# Patient Record
Sex: Male | Born: 1960 | Race: White | Hispanic: No | Marital: Single | Smoking: Current every day smoker
Health system: Southern US, Community
[De-identification: ages and names within clinical notes are randomized; demographics above are authoritative.]

## PROBLEM LIST (undated history)

## (undated) DIAGNOSIS — F32A Depression, unspecified: Secondary | ICD-10-CM

## (undated) DIAGNOSIS — F329 Major depressive disorder, single episode, unspecified: Secondary | ICD-10-CM

## (undated) DIAGNOSIS — I10 Essential (primary) hypertension: Secondary | ICD-10-CM

## (undated) DIAGNOSIS — F4310 Post-traumatic stress disorder, unspecified: Secondary | ICD-10-CM

## (undated) DIAGNOSIS — I219 Acute myocardial infarction, unspecified: Secondary | ICD-10-CM

## (undated) DIAGNOSIS — F419 Anxiety disorder, unspecified: Secondary | ICD-10-CM

## (undated) DIAGNOSIS — I2510 Atherosclerotic heart disease of native coronary artery without angina pectoris: Secondary | ICD-10-CM

## (undated) DIAGNOSIS — E785 Hyperlipidemia, unspecified: Secondary | ICD-10-CM

## (undated) HISTORY — DX: Major depressive disorder, single episode, unspecified: F32.9

## (undated) HISTORY — PX: OTHER SURGICAL HISTORY: SHX169

## (undated) HISTORY — DX: Depression, unspecified: F32.A

## (undated) HISTORY — PX: APPENDECTOMY: SHX54

## (undated) HISTORY — DX: Post-traumatic stress disorder, unspecified: F43.10

## (undated) HISTORY — PX: JOINT REPLACEMENT: SHX530

## (undated) HISTORY — DX: Acute myocardial infarction, unspecified: I21.9

## (undated) HISTORY — DX: Anxiety disorder, unspecified: F41.9

---

## 2008-07-19 ENCOUNTER — Emergency Department: Payer: Self-pay | Admitting: Internal Medicine

## 2008-12-21 ENCOUNTER — Emergency Department: Payer: Self-pay | Admitting: Emergency Medicine

## 2009-08-24 ENCOUNTER — Emergency Department: Payer: Self-pay | Admitting: Emergency Medicine

## 2009-08-25 ENCOUNTER — Observation Stay: Payer: Self-pay | Admitting: Internal Medicine

## 2009-08-25 ENCOUNTER — Ambulatory Visit: Payer: Self-pay | Admitting: Internal Medicine

## 2009-09-03 ENCOUNTER — Ambulatory Visit: Payer: Self-pay | Admitting: Internal Medicine

## 2009-09-24 ENCOUNTER — Ambulatory Visit: Payer: Self-pay | Admitting: Internal Medicine

## 2009-10-25 ENCOUNTER — Ambulatory Visit: Payer: Self-pay | Admitting: Internal Medicine

## 2009-11-25 ENCOUNTER — Ambulatory Visit: Payer: Self-pay | Admitting: Internal Medicine

## 2009-12-13 ENCOUNTER — Ambulatory Visit: Payer: Self-pay | Admitting: General Practice

## 2009-12-15 ENCOUNTER — Ambulatory Visit: Payer: Self-pay | Admitting: General Practice

## 2009-12-20 ENCOUNTER — Ambulatory Visit: Payer: Self-pay | Admitting: General Practice

## 2009-12-25 ENCOUNTER — Ambulatory Visit: Payer: Self-pay | Admitting: Internal Medicine

## 2010-03-27 HISTORY — PX: LUMBAR LAMINECTOMY: SHX95

## 2017-01-19 ENCOUNTER — Ambulatory Visit
Admission: EM | Admit: 2017-01-19 | Discharge: 2017-01-19 | Disposition: A | Discharge status: Home / Self Care | Payer: Commercial Managed Care - HMO | Attending: Family Medicine | Admitting: Family Medicine

## 2017-01-19 ENCOUNTER — Encounter: Payer: Self-pay | Admitting: Emergency Medicine

## 2017-01-19 DIAGNOSIS — M542 Cervicalgia: Secondary | ICD-10-CM

## 2017-01-19 DIAGNOSIS — F4321 Adjustment disorder with depressed mood: Secondary | ICD-10-CM

## 2017-01-19 DIAGNOSIS — F418 Other specified anxiety disorders: Secondary | ICD-10-CM | POA: Diagnosis not present

## 2017-01-19 HISTORY — DX: Hyperlipidemia, unspecified: E78.5

## 2017-01-19 HISTORY — DX: Essential (primary) hypertension: I10

## 2017-01-19 HISTORY — DX: Atherosclerotic heart disease of native coronary artery without angina pectoris: I25.10

## 2017-01-19 MED ORDER — LORAZEPAM 1 MG PO TABS: 1.0000 mg | ORAL_TABLET | Freq: Three times a day (TID) | ORAL | 0 refills | Status: DC | PRN

## 2017-01-19 MED ORDER — CYCLOBENZAPRINE HCL 10 MG PO TABS: 10.0000 mg | ORAL_TABLET | Freq: Three times a day (TID) | ORAL | 0 refills | Status: DC | PRN

## 2017-01-19 NOTE — ED Provider Notes (Signed)
[facility]    CSN: [id number] Arrival date & time: 01/19/17  1191     History   Chief Complaint Chief Complaint  Patient presents with  . Neck Pain  . Shoulder Pain    HPI James Bryan is a 56 y.o. male.   56 yo male with a c/o 6 days h/o upper back and neck pain worse on the left side which he attributes to severe tension. Denies any injury, falls, trauma, fevers, chills, vision changes, numbness/tingling. States he's been under an unbelievable amount of stress, tension and anxiety over the past week because of his 35 month old grandson died of SIDS on 2017/02/06 while his grandson was staying with him. Patient is grieving. Patient denies suicidal ideation. Patient states his grandson's funeral is tomorrow.    The history is provided by the patient.  Neck Pain  Shoulder Pain  Associated symptoms: neck pain     Past Medical History:  Diagnosis Date  . Coronary artery disease   . Hyperlipidemia   . Hypertension     There are no active problems to display for this patient.   Past Surgical History:  Procedure Laterality Date  . APPENDECTOMY    . JOINT REPLACEMENT Right    knee       Home Medications    Prior to Admission medications   Medication Sig Start Date End Date Taking? Authorizing Provider  aspirin EC 81 MG tablet Take 81 mg by mouth daily.   Yes [provider]  atorvastatin (LIPITOR) 40 MG tablet Take 40 mg by mouth daily.   Yes [provider]  clopidogrel (PLAVIX) 75 MG tablet Take 75 mg by mouth daily.   Yes [provider]  megestrol (MEGACE) 40 MG tablet Take 40 mg by mouth daily.   Yes [provider]  metoprolol succinate (TOPROL-XL) 100 MG 24 hr tablet Take 100 mg by mouth daily. Take with or immediately following a meal.   Yes [provider]  mirtazapine (REMERON) 15 MG tablet Take 15 mg by mouth at bedtime.   Yes [provider]  montelukast (SINGULAIR) 10 MG tablet Take 10  mg by mouth at bedtime.   Yes [provider]  QUEtiapine (SEROQUEL) 400 MG tablet Take 800 mg by mouth at bedtime.   Yes [provider]  Vitamin D, Ergocalciferol, (DRISDOL) 50000 units CAPS capsule Take 50,000 Units by mouth every 7 (seven) days.   Yes [provider]  cyclobenzaprine (FLEXERIL) 10 MG tablet Take 1 tablet (10 mg total) by mouth 3 (three) times daily as needed for muscle spasms. 01/19/17   Payton Mccallum, MD  LORazepam (ATIVAN) 1 MG tablet Take 1 tablet (1 mg total) by mouth every 8 (eight) hours as needed for anxiety. DO NOT TAKE WITH FLEXERIL 01/19/17   Payton Mccallum, MD    Family History Family History  Problem Relation Age of Onset  . Diabetes Mother   . Depression Mother   . Alzheimer's disease Mother   . Diabetes Father   . Glaucoma Father     Social History Social History  Substance Use Topics  . Smoking status: Current Every Day Smoker    Packs/day: 0.50    Types: Cigarettes  . Smokeless tobacco: Never Used  . Alcohol use No     Allergies   Penicillins and Sulfa antibiotics   Review of Systems Review of Systems  Musculoskeletal: Positive for neck pain.     Physical Exam Triage Vital Signs  ED Triage Vitals  Enc Vitals Group     BP 01/19/17 0907 115/76     Pulse Rate 01/19/17 0907 88     Resp 01/19/17 0907 16     Temp 01/19/17 0907 98.5 F (36.9 C)     Temp Source 01/19/17 0907 Oral     SpO2 01/19/17 0907 97 %     Weight 01/19/17 0906 162 lb (73.5 kg)     Height 01/19/17 0906 5\' 8"  (1.727 m)     Head Circumference --      Peak Flow --      Pain Score 01/19/17 0906 7     Pain Loc --      Pain Edu? --      Excl. in GC? --    No data found.   Updated Vital Signs BP 115/76 (BP Location: Right Arm)   Pulse 88   Temp 98.5 F (36.9 C) (Oral)   Resp 16   Ht 5\' 8"  (1.727 m)   Wt 162 lb (73.5 kg)   SpO2 97%   BMI 24.63 kg/m   Visual Acuity Right Eye Distance:   Left Eye Distance:   Bilateral  Distance:    Right Eye Near:   Left Eye Near:    Bilateral Near:     Physical Exam  Constitutional: He appears well-developed and well-nourished. No distress.  Neck: Normal range of motion. Neck supple. No tracheal deviation present.  Pulmonary/Chest: Effort normal. No stridor. No respiratory distress.  Musculoskeletal:       Cervical back: He exhibits tenderness (over the trapezius muscles bilaterally (left greater than right) and cervical paraspinous muscles) and spasm. He exhibits normal range of motion, no bony tenderness, no swelling, no edema, no deformity, no laceration, no pain and normal pulse.  Neurological: He is alert. He has normal reflexes. He displays normal reflexes. He exhibits normal muscle tone. Coordination normal.  Skin: No rash noted. He is not diaphoretic.  Nursing note and vitals reviewed.    UC Treatments / Results  Labs (all labs ordered are listed, but only abnormal results are displayed) Labs Reviewed - No data to display  EKG  EKG Interpretation None       Radiology No results found.  Procedures Procedures (including critical care time)  Medications Ordered in UC Medications - No data to display   Initial Impression / Assessment and Plan / UC Course  I have reviewed the triage vital signs and the nursing notes.  Pertinent labs & imaging results that were available during my care of the patient were reviewed by me and considered in my medical decision making (see chart for details).       Final Clinical Impressions(s) / UC Diagnoses   Final diagnoses:  Neck pain  Situational anxiety  Grieving    New Prescriptions Discharge Medication List as of 01/19/2017  9:37 AM    START taking these medications   Details  cyclobenzaprine (FLEXERIL) 10 MG tablet Take 1 tablet (10 mg total) by mouth 3 (three) times daily as needed for muscle spasms., Starting Fri 01/19/2017, Normal    LORazepam (ATIVAN) 1 MG tablet Take 1 tablet (1 mg total)  by mouth every 8 (eight) hours as needed for anxiety. DO NOT TAKE WITH FLEXERIL, Starting Fri 01/19/2017, Print       1. diagnosis reviewed with patient 2. rx as per orders above; reviewed possible side effects, interactions, risks and benefits  3. Recommend supportive treatment with behavioral/psychological therapy 4. Follow-up  with PCP 5. Follow-up  prn if symptoms worsen or don't improve   Controlled Substance Prescriptions [place] Controlled Substance Registry consulted? Not Applicable   Payton Mccallumonty, Tiare Rohlman, MD 01/19/17 1059

## 2017-01-19 NOTE — ED Triage Notes (Signed)
Patient in today c/o neck pain radiating to left shoulder. Patient states that he thinks it is due to tension. Patient's 303 month old grandson passed away of SIDS on 01/14/17.

## 2017-01-19 NOTE — Discharge Instructions (Signed)
Tylenol as needed Heat to area

## 2017-04-05 DIAGNOSIS — I2510 Atherosclerotic heart disease of native coronary artery without angina pectoris: Secondary | ICD-10-CM | POA: Insufficient documentation

## 2017-04-19 DIAGNOSIS — M549 Dorsalgia, unspecified: Secondary | ICD-10-CM

## 2017-04-19 DIAGNOSIS — G8929 Other chronic pain: Secondary | ICD-10-CM | POA: Insufficient documentation

## 2017-04-19 DIAGNOSIS — Z96651 Presence of right artificial knee joint: Secondary | ICD-10-CM

## 2017-04-19 DIAGNOSIS — M25561 Pain in right knee: Secondary | ICD-10-CM

## 2017-07-13 ENCOUNTER — Emergency Department: Payer: Medicare HMO

## 2017-07-13 ENCOUNTER — Other Ambulatory Visit: Payer: Self-pay

## 2017-07-13 ENCOUNTER — Emergency Department
Admission: EM | Admit: 2017-07-13 | Discharge: 2017-07-13 | Disposition: A | Discharge status: Home / Self Care | Payer: Medicare HMO | Attending: Emergency Medicine | Admitting: Emergency Medicine

## 2017-07-13 DIAGNOSIS — Z7982 Long term (current) use of aspirin: Secondary | ICD-10-CM | POA: Insufficient documentation

## 2017-07-13 DIAGNOSIS — Z79899 Other long term (current) drug therapy: Secondary | ICD-10-CM | POA: Diagnosis not present

## 2017-07-13 DIAGNOSIS — R569 Unspecified convulsions: Secondary | ICD-10-CM | POA: Diagnosis present

## 2017-07-13 DIAGNOSIS — F17210 Nicotine dependence, cigarettes, uncomplicated: Secondary | ICD-10-CM | POA: Insufficient documentation

## 2017-07-13 DIAGNOSIS — I2510 Atherosclerotic heart disease of native coronary artery without angina pectoris: Secondary | ICD-10-CM | POA: Diagnosis not present

## 2017-07-13 DIAGNOSIS — I119 Hypertensive heart disease without heart failure: Secondary | ICD-10-CM | POA: Diagnosis not present

## 2017-07-13 DIAGNOSIS — G40909 Epilepsy, unspecified, not intractable, without status epilepticus: Secondary | ICD-10-CM

## 2017-07-13 LAB — CBC WITH DIFFERENTIAL/PLATELET
BASOS ABS: 0 10*3/uL (ref 0–0.1)
BASOS PCT: 0 %
Eosinophils Absolute: 0.2 10*3/uL (ref 0–0.7)
Eosinophils Relative: 1 %
HEMATOCRIT: 40 % (ref 40.0–52.0)
HEMOGLOBIN: 13.3 g/dL (ref 13.0–18.0)
Lymphocytes Relative: 15 %
Lymphs Abs: 1.7 10*3/uL (ref 1.0–3.6)
MCH: 28.6 pg (ref 26.0–34.0)
MCHC: 33.4 g/dL (ref 32.0–36.0)
MCV: 85.6 fL (ref 80.0–100.0)
MONOS PCT: 10 %
Monocytes Absolute: 1.1 10*3/uL — ABNORMAL HIGH (ref 0.2–1.0)
NEUTROS ABS: 8.2 10*3/uL — AB (ref 1.4–6.5)
NEUTROS PCT: 74 %
Platelets: 184 10*3/uL (ref 150–440)
RBC: 4.67 MIL/uL (ref 4.40–5.90)
RDW: 15.6 % — ABNORMAL HIGH (ref 11.5–14.5)
WBC: 11.2 10*3/uL — ABNORMAL HIGH (ref 3.8–10.6)

## 2017-07-13 LAB — COMPREHENSIVE METABOLIC PANEL
ALBUMIN: 4 g/dL (ref 3.5–5.0)
ALT: 10 U/L — ABNORMAL LOW (ref 17–63)
ANION GAP: 6 (ref 5–15)
AST: 21 U/L (ref 15–41)
Alkaline Phosphatase: 145 U/L — ABNORMAL HIGH (ref 38–126)
BILIRUBIN TOTAL: 0.4 mg/dL (ref 0.3–1.2)
BUN: 8 mg/dL (ref 6–20)
CHLORIDE: 104 mmol/L (ref 101–111)
CO2: 28 mmol/L (ref 22–32)
Calcium: 9 mg/dL (ref 8.9–10.3)
Creatinine, Ser: 0.81 mg/dL (ref 0.61–1.24)
GFR calc Af Amer: >60 mL/min (ref >60)
GFR calc non Af Amer: >60 mL/min (ref >60)
GLUCOSE: 128 mg/dL — AB (ref 65–99)
POTASSIUM: 3.6 mmol/L (ref 3.5–5.1)
Sodium: 138 mmol/L (ref 135–145)
TOTAL PROTEIN: 7.4 g/dL (ref 6.5–8.1)

## 2017-07-13 LAB — GLUCOSE, CAPILLARY: GLUCOSE-CAPILLARY: 142 mg/dL — AB (ref 65–99)

## 2017-07-13 LAB — ETHANOL: Alcohol, Ethyl (B): <10 mg/dL (ref <10)

## 2017-07-13 MED ORDER — LEVETIRACETAM 500 MG PO TABS: 500.0000 mg | ORAL_TABLET | Freq: Two times a day (BID) | ORAL | 0 refills | Status: DC

## 2017-07-13 MED ORDER — LEVETIRACETAM 500 MG PO TABS
1000.0000 mg | ORAL_TABLET | Freq: Once | ORAL | Status: AC
  Administered 2017-07-13: 1000 mg via ORAL
  Filled 2017-07-13 (×2): qty 2

## 2017-07-13 MED ORDER — IBUPROFEN 600 MG PO TABS: 600.0000 mg | ORAL_TABLET | Freq: Three times a day (TID) | ORAL | 0 refills | Status: DC | PRN

## 2017-07-13 NOTE — ED Triage Notes (Signed)
Pt ambulatory into ED with c/o of "what looks like seizures" x 4 today. Pt in NAD.

## 2017-07-13 NOTE — ED Triage Notes (Signed)
Pt arrives to ED via POV from home with c/o seizures. Pt reports h/x orf same "as a teenager". Pt denies taking medications at this time for seizure management. No reported loss of bowels or bladder control during seizure. Pt states seizure was not witnesses. Pt reports RIGHT knee and RIGHT foot pain that started after the seizure but unsure of exact mechanism of injury. Pt is A&O, in NAD; RR even, regular, and unlabored.

## 2017-07-13 NOTE — ED Notes (Signed)
Pharmacy notified of need for keppra po.

## 2017-07-13 NOTE — Discharge Instructions (Addendum)
Please begin taking your Keppra twice a day and make an appointment to follow-up with the neurologist for reevaluation.  Return to the emergency department sooner for any concerns whatsoever.  It was a pleasure to take care of you today, and thank you for coming to our emergency department.  If you have any questions or concerns before leaving please ask the nurse to grab me and I'm more than happy to go through your aftercare instructions again.  If you were prescribed any opioid pain medication today such as Norco, Vicodin, Percocet, morphine, hydrocodone, or oxycodone please make sure you do not drive when you are taking this medication as it can alter your ability to drive safely.  If you have any concerns once you are home that you are not improving or are in fact getting worse before you can make it to your follow-up appointment, please do not hesitate to call 911 and come back for further evaluation.  Merrily Brittle, MD  Results for orders placed or performed during the hospital encounter of 07/13/17  Glucose, capillary  Result Value Ref Range   Glucose-Capillary 142 (H) 65 - 99 mg/dL  Comprehensive metabolic panel  Result Value Ref Range   Sodium 138 135 - 145 mmol/L   Potassium 3.6 3.5 - 5.1 mmol/L   Chloride 104 101 - 111 mmol/L   CO2 28 22 - 32 mmol/L   Glucose, Bld 128 (H) 65 - 99 mg/dL   BUN 8 6 - 20 mg/dL   Creatinine, Ser 4.09 0.61 - 1.24 mg/dL   Calcium 9.0 8.9 - 81.1 mg/dL   Total Protein 7.4 6.5 - 8.1 g/dL   Albumin 4.0 3.5 - 5.0 g/dL   AST 21 15 - 41 U/L   ALT 10 (L) 17 - 63 U/L   Alkaline Phosphatase 145 (H) 38 - 126 U/L   Total Bilirubin 0.4 0.3 - 1.2 mg/dL   GFR calc non Af Amer >60 >60 mL/min   GFR calc Af Amer >60 >60 mL/min   Anion gap 6 5 - 15  Ethanol  Result Value Ref Range   Alcohol, Ethyl (B) <10 <10 mg/dL  CBC with Differential  Result Value Ref Range   WBC 11.2 (H) 3.8 - 10.6 K/uL   RBC 4.67 4.40 - 5.90 MIL/uL   Hemoglobin 13.3 13.0 - 18.0 g/dL     HCT 91.4 78.2 - 95.6 %   MCV 85.6 80.0 - 100.0 fL   MCH 28.6 26.0 - 34.0 pg   MCHC 33.4 32.0 - 36.0 g/dL   RDW 21.3 (H) 08.6 - 57.8 %   Platelets 184 150 - 440 K/uL   Neutrophils Relative % 74 %   Neutro Abs 8.2 (H) 1.4 - 6.5 K/uL   Lymphocytes Relative 15 %   Lymphs Abs 1.7 1.0 - 3.6 K/uL   Monocytes Relative 10 %   Monocytes Absolute 1.1 (H) 0.2 - 1.0 K/uL   Eosinophils Relative 1 %   Eosinophils Absolute 0.2 0 - 0.7 K/uL   Basophils Relative 0 %   Basophils Absolute 0.0 0 - 0.1 K/uL   Ct Head Wo Contrast  Result Date: 07/13/2017 CLINICAL DATA:  Recent seizure activity EXAM: CT HEAD WITHOUT CONTRAST TECHNIQUE: Contiguous axial images were obtained from the base of the skull through the vertex without intravenous contrast. COMPARISON:  07/19/2008 FINDINGS: Brain: No evidence of acute infarction, hemorrhage, hydrocephalus, extra-axial collection or mass lesion/mass effect. Vascular: No hyperdense vessel or unexpected calcification. Skull: Normal. Negative for fracture  or focal lesion. Sinuses/Orbits: No acute finding. Other: None. IMPRESSION: Normal head CT Electronically Signed   By: Alcide CleverMark  Lukens M.D.   On: 07/13/2017 20:38   Dg Knee Complete 4 Views Right  Result Date: 07/13/2017 CLINICAL DATA:  Recent seizure activity with right knee pain, initial encounter EXAM: RIGHT KNEE - COMPLETE 4+ VIEW COMPARISON:  None. FINDINGS: Right knee prosthesis is noted. No acute fracture or dislocation is noted. No definitive loosening is seen. No joint effusion is noted. IMPRESSION: Right knee prosthesis without acute abnormality. Electronically Signed   By: Alcide CleverMark  Lukens M.D.   On: 07/13/2017 20:37   Dg Foot Complete Right  Result Date: 07/13/2017 CLINICAL DATA:  Seizure activity with foot pain, initial encounter EXAM: RIGHT FOOT COMPLETE - 3+ VIEW COMPARISON:  None. FINDINGS: There is no evidence of fracture or dislocation. There is no evidence of arthropathy or other focal bone abnormality. Soft  tissues are unremarkable. IMPRESSION: No acute abnormality noted. Electronically Signed   By: Alcide CleverMark  Lukens M.D.   On: 07/13/2017 20:35

## 2017-07-13 NOTE — ED Notes (Signed)
Pt's family remember reports seizure today, reports little to no post-ictal period, was "awake almost immediately". Pt reports petite mal seizures over the last week, pt's family reports approx 3 this week. Pt states hx of seizures at this time. Pt states used to take Tegretol and Dilantin in the past but no longer takes seizure medications. Pt c/o R leg pain at this time. States hx of knee replacement.

## 2017-07-13 NOTE — ED Provider Notes (Signed)
[facility] Emergency Department Provider Note  ____________________________________________   First MD Initiated Contact with Patient 07/13/17 1955     (approximate)  I have reviewed the triage vital signs and the nursing notes.   HISTORY  Chief Complaint Seizures   HPI James Bryan is a 57 y.o. male who self presents the emergency department with his family after possibly having multiple seizures today.  He has a former history of seizure disorder as a teenager but has not had seizures in around 30 years.  He has not seen a neurologist in that time.  He is not taking any antiepileptics.  He said that over the course of the past week he feels like he has had multiple "petit mall" seizures.  Today he was trying to go to the bathroom and the next thing he knew he woke up on the ground with right knee and right ankle pain.  His wife immediately came to his assistance and noted that he was initially confused although became normal rather quickly.  No fecal or urinary incontinence.  He denies recent fevers or chills.  His symptoms have been intermittent.  Sudden onset.  Nothing in particular seems to make them better or worse.  Past Medical History:  Diagnosis Date  . Coronary artery disease   . Hyperlipidemia   . Hypertension     There are no active problems to display for this patient.   Past Surgical History:  Procedure Laterality Date  . APPENDECTOMY    . JOINT REPLACEMENT Right    knee    Prior to Admission medications   Medication Sig Start Date End Date Taking? Authorizing Provider  aspirin EC 81 MG tablet Take 81 mg by mouth daily.    [provider]  atorvastatin (LIPITOR) 40 MG tablet Take 40 mg by mouth daily.    [provider]  clopidogrel (PLAVIX) 75 MG tablet Take 75 mg by mouth daily.    [provider]  cyclobenzaprine (FLEXERIL) 10 MG tablet Take 1 tablet (10 mg total) by mouth 3 (three) times daily as  needed for muscle spasms. 01/19/17   Payton Mccallum, MD  ibuprofen (ADVIL,MOTRIN) 600 MG tablet Take 1 tablet (600 mg total) by mouth every 8 (eight) hours as needed. 07/13/17   Merrily Brittle, MD  levETIRAcetam (KEPPRA) 500 MG tablet Take 1 tablet (500 mg total) by mouth 2 (two) times daily. 07/13/17   Merrily Brittle, MD  LORazepam (ATIVAN) 1 MG tablet Take 1 tablet (1 mg total) by mouth every 8 (eight) hours as needed for anxiety. DO NOT TAKE WITH FLEXERIL 01/19/17   Payton Mccallum, MD  megestrol (MEGACE) 40 MG tablet Take 40 mg by mouth daily.    [provider]  metoprolol succinate (TOPROL-XL) 100 MG 24 hr tablet Take 100 mg by mouth daily. Take with or immediately following a meal.    [provider]  mirtazapine (REMERON) 15 MG tablet Take 15 mg by mouth at bedtime.    [provider]  montelukast (SINGULAIR) 10 MG tablet Take 10 mg by mouth at bedtime.    [provider]  QUEtiapine (SEROQUEL) 400 MG tablet Take 800 mg by mouth at bedtime.    [provider]  Vitamin D, Ergocalciferol, (DRISDOL) 50000 units CAPS capsule Take 50,000 Units by mouth every 7 (seven) days.    [provider]    Allergies Penicillins and Sulfa antibiotics  Family History  Problem Relation Age of Onset  .  Diabetes Mother   . Depression Mother   . Alzheimer's disease Mother   . Diabetes Father   . Glaucoma Father     Social History Social History   Tobacco Use  . Smoking status: Current Every Day Smoker    Packs/day: 0.50    Types: Cigarettes  . Smokeless tobacco: Never Used  Substance Use Topics  . Alcohol use: No  . Drug use: No    Review of Systems Constitutional: No fever/chills Eyes: No visual changes. ENT: No sore throat. Cardiovascular: Denies chest pain. Respiratory: Denies shortness of breath. Gastrointestinal: No abdominal pain.  No nausea, no vomiting.  No diarrhea.  No constipation. Genitourinary: Negative for  dysuria. Musculoskeletal: Positive for knee and ankle pain Skin: Negative for rash. Neurological: Positive for seizure   ____________________________________________   PHYSICAL EXAM:  VITAL SIGNS: ED Triage Vitals  Enc Vitals Group     BP 07/13/17 1914 (!) 90/56     Pulse Rate 07/13/17 1914 98     Resp 07/13/17 1914 18     Temp 07/13/17 1914 98.3 F (36.8 C)     Temp Source 07/13/17 1914 Oral     SpO2 07/13/17 1914 96 %     Weight 07/13/17 1915 162 lb (73.5 kg)     Height 07/13/17 1915 5\' 8"  (1.727 m)     Head Circumference --      Peak Flow --      Pain Score 07/13/17 1915 6     Pain Loc --      Pain Edu? --      Excl. in GC? --     Constitutional: Alert and oriented x4 pleasant cooperative speaks in full clear sentences no diaphoresis Eyes: PERRL EOMI. midrange and brisk  Head: Atraumatic. Nose: No congestion/rhinnorhea. Mouth/Throat: No trismus no bites to the tongue or inside the mouth Neck: No stridor.   Cardiovascular: Normal rate, regular rhythm. Grossly normal heart sounds.  Good peripheral circulation. Respiratory: Normal respiratory effort.  No retractions. Lungs CTAB and moving good air Gastrointestinal: Soft nontender Musculoskeletal: No lower extremity edema no bony tenderness.  Knee stable with no effusion no bony tenderness to the foot although somewhat tender medially on the plantar surface Neurologic:  Normal speech and language. No gross focal neurologic deficits are appreciated. Skin:  Skin is warm, dry and intact. No rash noted. Psychiatric: Mood and affect are normal. Speech and behavior are normal.    ____________________________________________   DIFFERENTIAL includes but not limited to  Seizure, seizure disorder, intracerebral hemorrhage, dehydration, ventricular tachycardia ____________________________________________   LABS (all labs ordered are listed, but only abnormal results are displayed)  Labs Reviewed  GLUCOSE, CAPILLARY -  Abnormal; Notable for the following components:      Result Value   Glucose-Capillary 142 (*)    All other components within normal limits  COMPREHENSIVE METABOLIC PANEL - Abnormal; Notable for the following components:   Glucose, Bld 128 (*)    ALT 10 (*)    Alkaline Phosphatase 145 (*)    All other components within normal limits  CBC WITH DIFFERENTIAL/PLATELET - Abnormal; Notable for the following components:   WBC 11.2 (*)    RDW 15.6 (*)    Neutro Abs 8.2 (*)    Monocytes Absolute 1.1 (*)    All other components within normal limits  ETHANOL  CBG MONITORING, ED    Lab work reviewed by me with no obvious etiology of his seizures noted __________________________________________  EKG  ED ECG REPORT  Wells Guiles, the attending physician, personally viewed and interpreted this ECG.  Date: 07/14/2017 EKG Time:  Rate: 102 Rhythm: normal sinus rhythm QRS Axis: normal Intervals: normal ST/T Wave abnormalities: normal Narrative Interpretation: no evidence of acute ischemia  ____________________________________________  RADIOLOGY   Right foot x-ray reviewed by me with no acute disease Right knee x-ray reviewed by me with no acute disease Head CT reviewed by me with no acute disease ____________________________________________   PROCEDURES  Procedure(s) performed: no  Procedures  Critical Care performed: no  Observation: no ____________________________________________   INITIAL IMPRESSION / ASSESSMENT AND PLAN / ED COURSE  Pertinent labs & imaging results that were available during my care of the patient were reviewed by me and considered in my medical decision making (see chart for details).  The patient arrives after what sounds like a recurrent seizure.  I will give him a gram of Keppra orally now wall labs and head CT are pending.  Blood work is reassuring with no obvious etiology of his seizures.  I had a lengthy discussion with the patient and  family regarding the importance of reinitiating antiepileptics.  He does not have a driver's license at this point.  Provided resources to help and follow-up with urology.  Strict return precautions and given to the patient verbalizes understanding and agreement with plan.      ____________________________________________   FINAL CLINICAL IMPRESSION(S) / ED DIAGNOSES  Final diagnoses:  Seizure disorder (HCC)  Seizure (HCC)      NEW MEDICATIONS STARTED DURING THIS VISIT:  Discharge Medication List as of 07/13/2017  9:22 PM    START taking these medications   Details  ibuprofen (ADVIL,MOTRIN) 600 MG tablet Take 1 tablet (600 mg total) by mouth every 8 (eight) hours as needed., Starting Fri 07/13/2017, Print    levETIRAcetam (KEPPRA) 500 MG tablet Take 1 tablet (500 mg total) by mouth 2 (two) times daily., Starting Fri 07/13/2017, Print         Note:  This document was prepared using Dragon voice recognition software and may include unintentional dictation errors.     Merrily Brittle, MD 07/14/17 2240

## 2017-07-13 NOTE — ED Notes (Signed)
NAD noted at time of D/C. Pt denies questions or concerns. Pt taken to the lobby via wheelchair at this time.  

## 2017-10-07 ENCOUNTER — Emergency Department: Payer: Medicare HMO

## 2017-10-07 ENCOUNTER — Other Ambulatory Visit: Payer: Self-pay

## 2017-10-07 DIAGNOSIS — Z5321 Procedure and treatment not carried out due to patient leaving prior to being seen by health care provider: Secondary | ICD-10-CM | POA: Insufficient documentation

## 2017-10-07 DIAGNOSIS — R079 Chest pain, unspecified: Secondary | ICD-10-CM | POA: Insufficient documentation

## 2017-10-07 LAB — BASIC METABOLIC PANEL
Anion gap: 10 (ref 5–15)
BUN: 18 mg/dL (ref 6–20)
CO2: 25 mmol/L (ref 22–32)
CREATININE: 1.3 mg/dL — AB (ref 0.61–1.24)
Calcium: 10.3 mg/dL (ref 8.9–10.3)
Chloride: 104 mmol/L (ref 98–111)
GFR calc Af Amer: >60 mL/min (ref >60)
GLUCOSE: 108 mg/dL — AB (ref 70–99)
POTASSIUM: 4.2 mmol/L (ref 3.5–5.1)
SODIUM: 139 mmol/L (ref 135–145)

## 2017-10-07 LAB — CBC
HEMATOCRIT: 41.3 % (ref 40.0–52.0)
Hemoglobin: 13.5 g/dL (ref 13.0–18.0)
MCH: 26.6 pg (ref 26.0–34.0)
MCHC: 32.7 g/dL (ref 32.0–36.0)
MCV: 81.3 fL (ref 80.0–100.0)
PLATELETS: 236 10*3/uL (ref 150–440)
RBC: 5.08 MIL/uL (ref 4.40–5.90)
RDW: 15.9 % — AB (ref 11.5–14.5)
WBC: 14.1 10*3/uL — AB (ref 3.8–10.6)

## 2017-10-07 LAB — TROPONIN I: Troponin I: <0.03 ng/mL (ref <0.03)

## 2017-10-07 NOTE — ED Triage Notes (Signed)
Patient reports mid sternal chest pain that radiates straight through to back.  Reports previous cardiac history.

## 2017-10-08 ENCOUNTER — Telehealth: Payer: Self-pay | Admitting: Emergency Medicine

## 2017-10-08 ENCOUNTER — Emergency Department
Admission: EM | Admit: 2017-10-08 | Discharge: 2017-10-08 | Disposition: A | Discharge status: Home / Self Care | Payer: Medicare HMO | Attending: Emergency Medicine | Admitting: Emergency Medicine

## 2017-10-08 NOTE — Telephone Encounter (Signed)
Called patient due to lwot to inquire about condition and follow up plans.  No answer and no voicemail  

## 2017-12-07 ENCOUNTER — Encounter: Payer: Self-pay | Admitting: Emergency Medicine

## 2017-12-07 ENCOUNTER — Ambulatory Visit (INDEPENDENT_AMBULATORY_CARE_PROVIDER_SITE_OTHER): Payer: Medicare HMO

## 2017-12-07 ENCOUNTER — Ambulatory Visit
Admission: EM | Admit: 2017-12-07 | Discharge: 2017-12-07 | Disposition: A | Discharge status: Home / Self Care | Payer: Medicare HMO | Attending: Family Medicine | Admitting: Family Medicine

## 2017-12-07 ENCOUNTER — Other Ambulatory Visit: Payer: Self-pay

## 2017-12-07 DIAGNOSIS — M25512 Pain in left shoulder: Secondary | ICD-10-CM

## 2017-12-07 DIAGNOSIS — M7582 Other shoulder lesions, left shoulder: Secondary | ICD-10-CM

## 2017-12-07 DIAGNOSIS — M778 Other enthesopathies, not elsewhere classified: Secondary | ICD-10-CM

## 2017-12-07 MED ORDER — MELOXICAM 15 MG PO TABS: 15.0000 mg | ORAL_TABLET | Freq: Every day | ORAL | 0 refills | Status: DC

## 2017-12-07 MED ORDER — HYDROCODONE-ACETAMINOPHEN 5-325 MG PO TABS: 1.0000 | ORAL_TABLET | Freq: Four times a day (QID) | ORAL | 0 refills | Status: DC | PRN

## 2017-12-07 NOTE — Discharge Instructions (Addendum)
Certain to perform pendulum exercises 3 times daily for 2 minutes each time.  Apply ice to the area 20 minutes every 2 hours 4-5 times daily for comfort.   When Taking the Mobic, do not take Naprosyn concurrently.  Follow up with orthopedics as soon as possible

## 2017-12-07 NOTE — ED Provider Notes (Signed)
[facility]    CSN: [id number] Arrival date & time: 12/07/17  1330     History   Chief Complaint Chief Complaint  Patient presents with  . Shoulder Pain    left    HPI James Bryan is a 57 y.o. male.   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  He said he noticed this after [facility].  States that he noticed that the injection was given higher than the muscle.  He has not been using the shoulder because of the pain and last night was almost unbearable for sleep.  Is any movement of the shoulder is extremely painful particularly with abduction flexion or extension.  Internal/external rotation are not as bothersome.  He said no fever or chills.  Past Medical History:  Diagnosis Date  . Coronary artery disease   . Hyperlipidemia   . Hypertension     There are no active problems to display for this patient.   Past Surgical History:  Procedure Laterality Date  . APPENDECTOMY    . JOINT REPLACEMENT Right    knee       Home Medications    Prior to Admission medications   Medication Sig Start Date End Date Taking? Authorizing Provider  aspirin EC 81 MG tablet Take 81 mg by mouth daily.   Yes [provider]  atorvastatin (LIPITOR) 40 MG tablet Take 40 mg by mouth daily.   Yes [provider]  clopidogrel (PLAVIX) 75 MG tablet Take 75 mg by mouth daily.   Yes [provider]  cyclobenzaprine (FLEXERIL) 10 MG tablet Take 1 tablet (10 mg total) by mouth 3 (three) times daily as needed for muscle spasms. 01/19/17  Yes Payton Mccallumonty, Orlando, MD  ibuprofen (ADVIL,MOTRIN) 600 MG tablet Take 1 tablet (600 mg total) by mouth every 8 (eight) hours as needed. 07/13/17  Yes Merrily Brittleifenbark, Neil, MD  metoprolol succinate (TOPROL-XL) 100 MG 24 hr tablet Take 100 mg by mouth daily. Take with or immediately following a meal.   Yes [provider]  montelukast (SINGULAIR) 10 MG  tablet Take 10 mg by mouth at bedtime.   Yes [provider]  QUEtiapine (SEROQUEL) 400 MG tablet Take 800 mg by mouth at bedtime.   Yes [provider]  Vitamin D, Ergocalciferol, (DRISDOL) 50000 units CAPS capsule Take 50,000 Units by mouth every 7 (seven) days.   Yes [provider]  HYDROcodone-acetaminophen (NORCO/VICODIN) 5-325 MG tablet Take 1 tablet by mouth every 6 (six) hours as needed. 12/07/17   Lutricia Feiloemer, William P, PA-C  meloxicam (MOBIC) 15 MG tablet Take 1 tablet (15 mg total) by mouth daily. 12/07/17   Lutricia Feiloemer, William P, PA-C    Family History Family History  Problem Relation Age of Onset  . Diabetes Mother   . Depression Mother   . Alzheimer's disease Mother   . Diabetes Father   . Glaucoma Father     Social History Social History   Tobacco Use  . Smoking status: Current Every Day Smoker    Packs/day: 0.50    Types: Cigarettes  . Smokeless tobacco: Never Used  Substance Use Topics  . Alcohol use: No  . Drug use: No     Allergies   Penicillins and Sulfa antibiotics   Review of Systems Review of Systems  Constitutional: Positive for activity change. Negative for appetite change, chills, fatigue and fever.  Musculoskeletal: Positive for  arthralgias.  All other systems reviewed and are negative.    Physical Exam Triage Vital Signs ED Triage Vitals  Enc Vitals Group     BP 12/07/17 1338 (!) 146/91     Pulse Rate 12/07/17 1338 (!) 101     Resp 12/07/17 1338 16     Temp 12/07/17 1338 97.9 F (36.6 C)     Temp Source 12/07/17 1338 Oral     SpO2 12/07/17 1338 99 %     Weight 12/07/17 1339 167 lb (75.8 kg)     Height 12/07/17 1339 5\' 9"  (1.753 m)     Head Circumference --      Peak Flow --      Pain Score 12/07/17 1338 6     Pain Loc --      Pain Edu? --      Excl. in GC? --    No data found.  Updated Vital Signs BP (!) 146/91 (BP Location: Right Arm)   Pulse (!) 101   Temp 97.9 F (36.6 C) (Oral)   Resp 16   Ht 5'  9" (1.753 m)   Wt 167 lb (75.8 kg)   SpO2 99%   BMI 24.66 kg/m   Visual Acuity Right Eye Distance:   Left Eye Distance:   Bilateral Distance:    Right Eye Near:   Left Eye Near:    Bilateral Near:     Physical Exam  Constitutional: He is oriented to person, place, and time. He appears well-developed and well-nourished. No distress.  HENT:  Head: Normocephalic.  Eyes: Pupils are equal, round, and reactive to light. Right eye exhibits no discharge. Left eye exhibits no discharge.  Neck: Normal range of motion.  Musculoskeletal: He exhibits tenderness.  Exam of the left shoulder is no swelling ecchymosis or erythema.  Not warm to the touch.  Tenderness subacromially to your through posterior.  The lightest touching causes the patient to flinch.  Clavicle is not tender.  Neither is the acromioclavicular joint.  There is very minimal passive range of motion to flexion extension and abduction only to approximately 25 degrees.  Internal rotation and external rotation are largely unaffected.  Neurological: He is alert and oriented to person, place, and time.  Skin: Skin is warm and dry. He is not diaphoretic.  Psychiatric: He has a normal mood and affect. His behavior is normal. Judgment and thought content normal.  Nursing note and vitals reviewed.    UC Treatments / Results  Labs (all labs ordered are listed, but only abnormal results are displayed) Labs Reviewed - No data to display  EKG None  Radiology Dg Shoulder Left  Result Date: 12/07/2017 CLINICAL DATA:  Patient with left shoulder pain and decreased range of motion. EXAM: LEFT SHOULDER - 2+ VIEW COMPARISON:  None. FINDINGS: Normal anatomic alignment. No evidence for acute fracture or dislocation. Visualized left hemithorax unremarkable. IMPRESSION: No acute osseous abnormality. Electronically Signed   By: Annia Belt M.D.   On: 12/07/2017 14:41    Procedures Procedures (including critical care time)  Medications  Ordered in UC Medications - No data to display  Initial Impression / Assessment and Plan / UC Course  I have reviewed the triage vital signs and the nursing notes.  Pertinent labs & imaging results that were available during my care of the patient were reviewed by me and considered in my medical decision making (see chart for details).   Have told the patient I think he may have a  chemical rotator cuff tendinitis from the injection .  ReCommended that he follow-up with orthopedics in a week or so.  In the meantime I will have him perform pendulum exercises 3 times daily for 2 minutes each time to help prevent frozen shoulder.  Told him we will switch him from the Naprosyn to Mobic to see if this provides any additional pain relief.  In addition I have given him 6 Vicodin tablets to help him sleep at nighttime until he is able to use his shoulder more frequently. [place] substance abuse registry was consulted and the patient not had any opioid prescriptions in the last year other than cough syrup.  Given the phone number and address of a an orthopedic group that he will arrange for an appointment.   Final Clinical Impressions(s) / UC Diagnoses   Final diagnoses:  Subacromial tendonitis of left shoulder     Discharge Instructions     Certain to perform pendulum exercises 3 times daily for 2 minutes each time.  Apply ice to the area 20 minutes every 2 hours 4-5 times daily for comfort.   When Taking the Mobic, do not take Naprosyn concurrently.  Follow up with orthopedics as soon as possible    ED Prescriptions    Medication Sig Dispense Auth. Provider   meloxicam (MOBIC) 15 MG tablet Take 1 tablet (15 mg total) by mouth daily. 30 tablet Lutricia Feil, PA-C   HYDROcodone-acetaminophen (NORCO/VICODIN) 5-325 MG tablet Take 1 tablet by mouth every 6 (six) hours as needed. 6 tablet Lutricia Feil, PA-C     Controlled Substance Prescriptions [place] Controlled Substance Registry  consulted? No prescriptions opioids in the last year   Joesph July 12/07/17 1530

## 2017-12-07 NOTE — ED Triage Notes (Signed)
Patient in today c/o left shoulder pain x 3 weeks after [facility]

## 2017-12-11 DIAGNOSIS — M7502 Adhesive capsulitis of left shoulder: Secondary | ICD-10-CM | POA: Insufficient documentation

## 2017-12-12 ENCOUNTER — Other Ambulatory Visit: Payer: Self-pay

## 2017-12-12 ENCOUNTER — Encounter: Payer: Self-pay | Admitting: Emergency Medicine

## 2017-12-12 ENCOUNTER — Ambulatory Visit
Admission: EM | Admit: 2017-12-12 | Discharge: 2017-12-12 | Disposition: A | Discharge status: Home / Self Care | Payer: Medicare HMO | Attending: Family Medicine | Admitting: Family Medicine

## 2017-12-12 DIAGNOSIS — M25512 Pain in left shoulder: Secondary | ICD-10-CM | POA: Diagnosis not present

## 2017-12-12 MED ORDER — TRAMADOL HCL 50 MG PO TABS: ORAL_TABLET | ORAL | 0 refills | Status: DC

## 2017-12-12 NOTE — ED Provider Notes (Signed)
[facility]    CSN: [id number] Arrival date & time: 12/12/17  1525     History   Chief Complaint Chief Complaint  Patient presents with  . Shoulder Pain    HPI James Bryan is a 57 y.o. male.   57 yo male with a c/o left shoulder pain, here for "something for pain" because orthopedist not available for 2 days. Patient states his shoulder was injured when he received a flu shot and has been having pain since. States he saw an orthopedist yesterday who prescribed prednisone, however patient developed hives, took benadryl to counteract the reaction and called the orthopedist office today but was told he was not available. Patient states orthopedist referred him to physical therapy and he has an appointment set up.   The history is provided by the patient.  Shoulder Pain    Past Medical History:  Diagnosis Date  . Coronary artery disease   . Hyperlipidemia   . Hypertension     There are no active problems to display for this patient.   Past Surgical History:  Procedure Laterality Date  . APPENDECTOMY    . JOINT REPLACEMENT Right    knee       Home Medications    Prior to Admission medications   Medication Sig Start Date End Date Taking? Authorizing Provider  aspirin EC 81 MG tablet Take 81 mg by mouth daily.   Yes [provider]  atorvastatin (LIPITOR) 40 MG tablet Take 40 mg by mouth daily.   Yes [provider]  cyclobenzaprine (FLEXERIL) 10 MG tablet Take 1 tablet (10 mg total) by mouth 3 (three) times daily as needed for muscle spasms. 01/19/17  Yes Payton Mccallum, MD  ibuprofen (ADVIL,MOTRIN) 600 MG tablet Take 1 tablet (600 mg total) by mouth every 8 (eight) hours as needed. 07/13/17  Yes Merrily Brittle, MD  meloxicam (MOBIC) 15 MG tablet Take 1 tablet (15 mg total) by mouth daily. 12/07/17  Yes Lutricia Feil, PA-C  metoprolol succinate (TOPROL-XL) 100 MG 24 hr tablet Take 100 mg by mouth daily. Take with or immediately  following a meal.   Yes [provider]  QUEtiapine (SEROQUEL) 400 MG tablet Take 800 mg by mouth at bedtime.   Yes [provider]  Vitamin D, Ergocalciferol, (DRISDOL) 50000 units CAPS capsule Take 50,000 Units by mouth every 7 (seven) days.   Yes [provider]  clopidogrel (PLAVIX) 75 MG tablet Take 75 mg by mouth daily.    [provider]  HYDROcodone-acetaminophen (NORCO/VICODIN) 5-325 MG tablet Take 1 tablet by mouth every 6 (six) hours as needed. 12/07/17   Lutricia Feil, PA-C  montelukast (SINGULAIR) 10 MG tablet Take 10 mg by mouth at bedtime.    [provider]  traMADol Janean Sark) 50 MG tablet 1-2 tabs po q 8 hours prn 12/12/17   Payton Mccallum, MD    Family History Family History  Problem Relation Age of Onset  . Diabetes Mother   . Depression Mother   . Alzheimer's disease Mother   . Diabetes Father   . Glaucoma Father     Social History Social History   Tobacco Use  . Smoking status: Current Every Day Smoker    Packs/day: 0.50    Types: Cigarettes  . Smokeless tobacco: Never Used  Substance Use Topics  . Alcohol use: No  . Drug use: No     Allergies   Penicillins; Sulfa antibiotics; Corticosteroids; and Prednisone   Review  of Systems Review of Systems   Physical Exam Triage Vital Signs ED Triage Vitals  Enc Vitals Group     BP 12/12/17 1541 140/80     Pulse Rate 12/12/17 1541 (!) 102     Resp 12/12/17 1541 18     Temp 12/12/17 1541 97.9 F (36.6 C)     Temp Source 12/12/17 1541 Oral     SpO2 12/12/17 1541 98 %     Weight 12/12/17 1542 165 lb (74.8 kg)     Height 12/12/17 1542 5\' 9"  (1.753 m)     Head Circumference --      Peak Flow --      Pain Score 12/12/17 1542 7     Pain Loc --      Pain Edu? --      Excl. in GC? --    No data found.  Updated Vital Signs BP 140/80 (BP Location: Right Arm)   Pulse (!) 102   Temp 97.9 F (36.6 C) (Oral)   Resp 18   Ht 5\' 9"  (1.753 m)   Wt 74.8 kg    SpO2 98%   BMI 24.37 kg/m   Visual Acuity Right Eye Distance:   Left Eye Distance:   Bilateral Distance:    Right Eye Near:   Left Eye Near:    Bilateral Near:     Physical Exam  Constitutional: He appears well-developed and well-nourished. No distress.  Skin: He is not diaphoretic.  Nursing note and vitals reviewed.    UC Treatments / Results  Labs (all labs ordered are listed, but only abnormal results are displayed) Labs Reviewed - No data to display  EKG None  Radiology No results found.  Procedures Procedures (including critical care time)  Medications Ordered in UC Medications - No data to display  Initial Impression / Assessment and Plan / UC Course  I have reviewed the triage vital signs and the nursing notes.  Pertinent labs & imaging results that were available during my care of the patient were reviewed by me and considered in my medical decision making (see chart for details).      Final Clinical Impressions(s) / UC Diagnoses   Final diagnoses:  Left shoulder pain, unspecified chronicity     Discharge Instructions     Follow up with PT and orthopedist as scheduled   ED Prescriptions    Medication Sig Dispense Auth. Provider   traMADol (ULTRAM) 50 MG tablet 1-2 tabs po q 8 hours prn 20 tablet Maleny Candy, Pamala Hurryrlando, MD     1. diagnosis reviewed with patient 2. rx as per orders above; reviewed possible side effects, interactions, risks and benefits  3. Follow-up with PT and ortho   Controlled Substance Prescriptions [place] Controlled Substance Registry consulted? Not Applicable   Payton Mccallumonty, Berna Gitto, MD 12/12/17 1626

## 2017-12-12 NOTE — Discharge Instructions (Signed)
Follow up with PT and orthopedist as scheduled

## 2017-12-12 NOTE — ED Triage Notes (Signed)
Patient stated he was seen 09/13 for shoulder pain related to the flu vaccination he received. Patient stated [facility] and was seen yesterday for the shoulder pain. Patient stated he was given oral Prednisone to take and developed hives last night. Stated he took Benadryl last night with relief. Patient contacted [facility] asking for something else for pain and they told him that the provider would not be available for 2 days and he would have to be seen on Friday. Patient stated he came here to get something for pain. Patient is still taking Meloxicam that he was prescribed here at Urgent Care and Flexeril that [facility] was [facility].

## 2017-12-15 ENCOUNTER — Emergency Department
Admission: EM | Admit: 2017-12-15 | Discharge: 2017-12-15 | Disposition: A | Discharge status: Home / Self Care | Payer: Medicare HMO | Attending: Emergency Medicine | Admitting: Emergency Medicine

## 2017-12-15 ENCOUNTER — Other Ambulatory Visit: Payer: Self-pay

## 2017-12-15 ENCOUNTER — Encounter: Payer: Self-pay | Admitting: Emergency Medicine

## 2017-12-15 DIAGNOSIS — Z7982 Long term (current) use of aspirin: Secondary | ICD-10-CM | POA: Diagnosis not present

## 2017-12-15 DIAGNOSIS — Z96651 Presence of right artificial knee joint: Secondary | ICD-10-CM | POA: Diagnosis not present

## 2017-12-15 DIAGNOSIS — I10 Essential (primary) hypertension: Secondary | ICD-10-CM | POA: Diagnosis not present

## 2017-12-15 DIAGNOSIS — Z79899 Other long term (current) drug therapy: Secondary | ICD-10-CM | POA: Diagnosis not present

## 2017-12-15 DIAGNOSIS — M7502 Adhesive capsulitis of left shoulder: Secondary | ICD-10-CM | POA: Diagnosis not present

## 2017-12-15 DIAGNOSIS — M25512 Pain in left shoulder: Secondary | ICD-10-CM | POA: Diagnosis present

## 2017-12-15 DIAGNOSIS — F17210 Nicotine dependence, cigarettes, uncomplicated: Secondary | ICD-10-CM | POA: Insufficient documentation

## 2017-12-15 DIAGNOSIS — Z7902 Long term (current) use of antithrombotics/antiplatelets: Secondary | ICD-10-CM | POA: Diagnosis not present

## 2017-12-15 MED ORDER — LIDOCAINE 5 % EX PTCH: 1.0000 | MEDICATED_PATCH | Freq: Two times a day (BID) | CUTANEOUS | 0 refills | Status: DC

## 2017-12-15 MED ORDER — LIDOCAINE 5 % EX PTCH
1.0000 | MEDICATED_PATCH | CUTANEOUS | Status: DC
  Administered 2017-12-15: 1 via TRANSDERMAL
  Filled 2017-12-15: qty 1

## 2017-12-15 NOTE — ED Triage Notes (Signed)
R shoulder pain. Problems related to flu shot august 31st. Here for pain control. Taking tramadol without relief.

## 2017-12-15 NOTE — ED Provider Notes (Signed)
[facility] Emergency Department Provider Note   ____________________________________________   First MD Initiated Contact with Patient 12/15/17 1432     (approximate)  I have reviewed the triage vital signs and the nursing notes.   HISTORY  Chief Complaint Shoulder Pain    HPI James Bryan is a 57 y.o. male patient complain of left shoulder pain secondary to receiving a flu injection last month.  Patient was seen by orthopedics and diagnosed with adhesive capsulitis.  Patient stated no relief with Percocet and tramadol.  Patient states here for pain relief.  Patient is scheduled follow-up for physical therapy starting in 2 days.  Patient rates pain a 7/10.  Patient described pain is "constant ache".  Patient stated decreased range of motion with abduction overhead reaching.  Past Medical History:  Diagnosis Date  . Coronary artery disease   . Hyperlipidemia   . Hypertension     There are no active problems to display for this patient.   Past Surgical History:  Procedure Laterality Date  . APPENDECTOMY    . JOINT REPLACEMENT Right    knee    Prior to Admission medications   Medication Sig Start Date End Date Taking? Authorizing Provider  aspirin EC 81 MG tablet Take 81 mg by mouth daily.    [provider]  atorvastatin (LIPITOR) 40 MG tablet Take 40 mg by mouth daily.    [provider]  clopidogrel (PLAVIX) 75 MG tablet Take 75 mg by mouth daily.    [provider]  cyclobenzaprine (FLEXERIL) 10 MG tablet Take 1 tablet (10 mg total) by mouth 3 (three) times daily as needed for muscle spasms. 01/19/17   Payton Mccallumonty, [place], MD  HYDROcodone-acetaminophen (NORCO/VICODIN) 5-325 MG tablet Take 1 tablet by mouth every 6 (six) hours as needed. 12/07/17   Lutricia Feiloemer, William P, PA-C  ibuprofen (ADVIL,MOTRIN) 600 MG tablet Take 1 tablet (600 mg total) by mouth every 8 (eight) hours as needed. 07/13/17   Merrily Brittleifenbark, Neil, MD  lidocaine  (LIDODERM) 5 % Place 1 patch onto the skin every 12 (twelve) hours. Remove & Discard patch within 12 hours or as directed by MD 12/15/17 12/15/18  Joni ReiningSmith, Ronald K, PA-C  meloxicam (MOBIC) 15 MG tablet Take 1 tablet (15 mg total) by mouth daily. 12/07/17   Lutricia Feiloemer, William P, PA-C  metoprolol succinate (TOPROL-XL) 100 MG 24 hr tablet Take 100 mg by mouth daily. Take with or immediately following a meal.    [provider]  montelukast (SINGULAIR) 10 MG tablet Take 10 mg by mouth at bedtime.    [provider]  QUEtiapine (SEROQUEL) 400 MG tablet Take 800 mg by mouth at bedtime.    [provider]  traMADol Janean Sark(ULTRAM) 50 MG tablet 1-2 tabs po q 8 hours prn 12/12/17   Payton Mccallumonty, [place], MD  Vitamin D, Ergocalciferol, (DRISDOL) 50000 units CAPS capsule Take 50,000 Units by mouth every 7 (seven) days.    [provider]    Allergies Penicillins; Sulfa antibiotics; Corticosteroids; and Prednisone  Family History  Problem Relation Age of Onset  . Diabetes Mother   . Depression Mother   . Alzheimer's disease Mother   . Diabetes Father   . Glaucoma Father     Social History Social History   Tobacco Use  . Smoking status: Current Every Day Smoker    Packs/day: 0.50    Types: Cigarettes  . Smokeless tobacco: Never Used  Substance Use Topics  . Alcohol use: No  .  Drug use: No    Review of Systems Constitutional: No fever/chills Eyes: No visual changes. ENT: No sore throat. Cardiovascular: Denies chest pain. Respiratory: Denies shortness of breath. Gastrointestinal: No abdominal pain.  No nausea, no vomiting.  No diarrhea.  No constipation. Genitourinary: Negative for dysuria. Musculoskeletal: Left shoulder pain. Skin: Negative for rash. Neurological: Negative for headaches, focal weakness or numbness. Endocrine:Hyperlipidemia and hypertension. Allergic/Immunilogical: See medication list. ____________________________________________   PHYSICAL  EXAM:  VITAL SIGNS: ED Triage Vitals [12/15/17 1401]  Enc Vitals Group     BP (!) 137/95     Pulse Rate (!) 110     Resp 18     Temp 98.2 F (36.8 C)     Temp Source Oral     SpO2 95 %     Weight 165 lb (74.8 kg)     Height 5\' 8"  (1.727 m)     Head Circumference      Peak Flow      Pain Score 7     Pain Loc      Pain Edu?      Excl. in GC?    Constitutional: Alert and oriented. Well appearing and in no acute distress. Cardiovascular: Tachycardic, regular rhythm. Grossly normal heart sounds.  Good peripheral circulation. Respiratory: Normal respiratory effort.  No retractions. Lungs CTAB. Musculoskeletal: No obvious deformity to the left shoulder.  Patient is moderate guarding palpation notes.  Humerus.  Decreased range of motion with abduction overhead reaching. Neurologic:  Normal speech and language. No gross focal neurologic deficits are appreciated. No gait instability. Skin:  Skin is warm, dry and intact. No rash noted. Psychiatric: Mood and affect are normal. Speech and behavior are normal.  ____________________________________________   LABS (all labs ordered are listed, but only abnormal results are displayed)  Labs Reviewed - No data to display ____________________________________________  EKG   ____________________________________________  RADIOLOGY  ED MD interpretation:    Official radiology report(s): No results found.  ____________________________________________   PROCEDURES  Procedure(s) performed: None  Procedures  Critical Care performed: No  ____________________________________________   INITIAL IMPRESSION / ASSESSMENT AND PLAN / ED COURSE  As part of my medical decision making, I reviewed the following data within the electronic MEDICAL RECORD NUMBER    Left shoulder pain secondary to adhesive capsulitis.  Advised patient continue with scheduled physical therapy and orthopedic appointments.  Patient given discharge care instruction.   Patient given a prescription for Lidoderm patch.      ____________________________________________   FINAL CLINICAL IMPRESSION(S) / ED DIAGNOSES  Final diagnoses:  Adhesive capsulitis of left shoulder     ED Discharge Orders         Ordered    lidocaine (LIDODERM) 5 %  Every 12 hours     12/15/17 1446           Note:  This document was prepared using Dragon voice recognition software and may include unintentional dictation errors.    Joni Reining, PA-C 12/15/17 1452    Sharyn Creamer, MD 12/15/17 [phone]

## 2017-12-15 NOTE — Discharge Instructions (Signed)
Follow-up with scheduled physical therapy and orthopedic appointments.

## 2017-12-15 NOTE — ED Notes (Signed)
Pt states was seen and dx with .Marland Kitchen. Encapsulated shoulder.. By ortho and placed on meloxicam and tramadol. Has follow up appt with ortho but states the pain medications are not strong enuf.

## 2018-01-30 DIAGNOSIS — M25519 Pain in unspecified shoulder: Secondary | ICD-10-CM | POA: Insufficient documentation

## 2018-02-18 ENCOUNTER — Other Ambulatory Visit: Payer: Self-pay | Admitting: Orthopedic Surgery

## 2018-02-18 DIAGNOSIS — M7502 Adhesive capsulitis of left shoulder: Secondary | ICD-10-CM

## 2018-03-07 ENCOUNTER — Ambulatory Visit
Admission: RE | Admit: 2018-03-07 | Discharge: 2018-03-07 | Disposition: A | Discharge status: Home / Self Care | Payer: Medicare HMO | Source: Ambulatory Visit | Attending: Orthopedic Surgery | Admitting: Orthopedic Surgery

## 2018-03-07 ENCOUNTER — Encounter (INDEPENDENT_AMBULATORY_CARE_PROVIDER_SITE_OTHER): Payer: Self-pay

## 2018-03-07 DIAGNOSIS — M19012 Primary osteoarthritis, left shoulder: Secondary | ICD-10-CM | POA: Diagnosis not present

## 2018-03-07 DIAGNOSIS — M25512 Pain in left shoulder: Secondary | ICD-10-CM | POA: Diagnosis not present

## 2018-03-07 DIAGNOSIS — G8929 Other chronic pain: Secondary | ICD-10-CM | POA: Diagnosis not present

## 2018-03-07 DIAGNOSIS — M25312 Other instability, left shoulder: Secondary | ICD-10-CM | POA: Diagnosis not present

## 2018-03-07 DIAGNOSIS — M7502 Adhesive capsulitis of left shoulder: Secondary | ICD-10-CM | POA: Diagnosis not present

## 2018-03-18 ENCOUNTER — Emergency Department
Admission: EM | Admit: 2018-03-18 | Discharge: 2018-03-18 | Disposition: A | Discharge status: Home / Self Care | Payer: Medicare HMO | Attending: Emergency Medicine | Admitting: Emergency Medicine

## 2018-03-18 ENCOUNTER — Emergency Department: Payer: Medicare HMO

## 2018-03-18 ENCOUNTER — Other Ambulatory Visit: Payer: Self-pay

## 2018-03-18 DIAGNOSIS — F17210 Nicotine dependence, cigarettes, uncomplicated: Secondary | ICD-10-CM | POA: Diagnosis not present

## 2018-03-18 DIAGNOSIS — Z7982 Long term (current) use of aspirin: Secondary | ICD-10-CM | POA: Insufficient documentation

## 2018-03-18 DIAGNOSIS — Z7902 Long term (current) use of antithrombotics/antiplatelets: Secondary | ICD-10-CM | POA: Diagnosis not present

## 2018-03-18 DIAGNOSIS — Z96651 Presence of right artificial knee joint: Secondary | ICD-10-CM | POA: Insufficient documentation

## 2018-03-18 DIAGNOSIS — I10 Essential (primary) hypertension: Secondary | ICD-10-CM | POA: Diagnosis not present

## 2018-03-18 DIAGNOSIS — R109 Unspecified abdominal pain: Secondary | ICD-10-CM | POA: Diagnosis not present

## 2018-03-18 DIAGNOSIS — Z79899 Other long term (current) drug therapy: Secondary | ICD-10-CM | POA: Diagnosis not present

## 2018-03-18 DIAGNOSIS — I2510 Atherosclerotic heart disease of native coronary artery without angina pectoris: Secondary | ICD-10-CM | POA: Diagnosis not present

## 2018-03-18 LAB — URINALYSIS, COMPLETE (UACMP) WITH MICROSCOPIC
BILIRUBIN URINE: NEGATIVE
Bacteria, UA: NONE SEEN
GLUCOSE, UA: NEGATIVE mg/dL
KETONES UR: NEGATIVE mg/dL
Nitrite: NEGATIVE
PROTEIN: 30 mg/dL — AB
Specific Gravity, Urine: 1.011 (ref 1.005–1.030)
pH: 8 (ref 5.0–8.0)

## 2018-03-18 LAB — CBC
HCT: 43.6 % (ref 39.0–52.0)
Hemoglobin: 13.9 g/dL (ref 13.0–17.0)
MCH: 26.8 pg (ref 26.0–34.0)
MCHC: 31.9 g/dL (ref 30.0–36.0)
MCV: 84 fL (ref 80.0–100.0)
PLATELETS: 244 10*3/uL (ref 150–400)
RBC: 5.19 MIL/uL (ref 4.22–5.81)
RDW: 17.1 % — AB (ref 11.5–15.5)
WBC: 13.1 10*3/uL — ABNORMAL HIGH (ref 4.0–10.5)
nRBC: 0 % (ref 0.0–0.2)

## 2018-03-18 LAB — BASIC METABOLIC PANEL
ANION GAP: 9 (ref 5–15)
BUN: 10 mg/dL (ref 6–20)
CALCIUM: 9.7 mg/dL (ref 8.9–10.3)
CO2: 26 mmol/L (ref 22–32)
CREATININE: 0.72 mg/dL (ref 0.61–1.24)
Chloride: 101 mmol/L (ref 98–111)
GFR calc Af Amer: >60 mL/min (ref >60)
GLUCOSE: 95 mg/dL (ref 70–99)
Potassium: 3.7 mmol/L (ref 3.5–5.1)
Sodium: 136 mmol/L (ref 135–145)

## 2018-03-18 MED ORDER — TRAMADOL HCL 50 MG PO TABS: 50.0000 mg | ORAL_TABLET | Freq: Four times a day (QID) | ORAL | 0 refills | Status: DC | PRN

## 2018-03-18 MED ORDER — KETOROLAC TROMETHAMINE 30 MG/ML IJ SOLN
30.0000 mg | Freq: Once | INTRAMUSCULAR | Status: AC
  Administered 2018-03-18: 30 mg via INTRAVENOUS
  Filled 2018-03-18: qty 1

## 2018-03-18 MED ORDER — ONDANSETRON HCL 4 MG/2ML IJ SOLN
4.0000 mg | Freq: Once | INTRAMUSCULAR | Status: AC
  Administered 2018-03-18: 4 mg via INTRAVENOUS
  Filled 2018-03-18: qty 2

## 2018-03-18 MED ORDER — MORPHINE SULFATE (PF) 4 MG/ML IV SOLN
4.0000 mg | Freq: Once | INTRAVENOUS | Status: AC
  Administered 2018-03-18: 4 mg via INTRAVENOUS
  Filled 2018-03-18: qty 1

## 2018-03-18 MED ORDER — NAPROXEN 500 MG PO TABS: 500.0000 mg | ORAL_TABLET | Freq: Two times a day (BID) | ORAL | 2 refills | Status: DC

## 2018-03-18 MED ORDER — SODIUM CHLORIDE 0.9 % IV SOLN
1000.0000 mL | Freq: Once | INTRAVENOUS | Status: AC
  Administered 2018-03-18: 1000 mL via INTRAVENOUS

## 2018-03-18 NOTE — ED Provider Notes (Signed)
[facility], [facility] Emergency Department Provider Note   ____________________________________________    I have reviewed the triage vital signs and the nursing notes.   HISTORY  Chief Complaint Flank Pain     HPI James Bryan is a 57 y.o. male who presents with complaints of left flank pain.  Patient reports a history of kidney stones in the past, he reports he had one that was so large that required a partial nephrectomy, this was over 30 years ago, he has been doing well since then, no problems with kidney function.  He reports his left flank started hurting yesterday and is worsened today, he saw hematuria this morning.  Denies fevers or chills.  No dysuria.  Mild nausea, no vomiting.  No radiation at this point.  Has not take anything for this   Past Medical History:  Diagnosis Date  . Coronary artery disease   . Hyperlipidemia   . Hypertension     There are no active problems to display for this patient.   Past Surgical History:  Procedure Laterality Date  . APPENDECTOMY    . JOINT REPLACEMENT Right    knee    Prior to Admission medications   Medication Sig Start Date End Date Taking? Authorizing Provider  aspirin EC 81 MG tablet Take 81 mg by mouth daily.    [provider]  atorvastatin (LIPITOR) 40 MG tablet Take 40 mg by mouth daily.    [provider]  clopidogrel (PLAVIX) 75 MG tablet Take 75 mg by mouth daily.    [provider]  cyclobenzaprine (FLEXERIL) 10 MG tablet Take 1 tablet (10 mg total) by mouth 3 (three) times daily as needed for muscle spasms. 01/19/17   Payton Mccallumonty, [place], MD  HYDROcodone-acetaminophen (NORCO/VICODIN) 5-325 MG tablet Take 1 tablet by mouth every 6 (six) hours as needed. 12/07/17   Lutricia Feiloemer, William P, PA-C  ibuprofen (ADVIL,MOTRIN) 600 MG tablet Take 1 tablet (600 mg total) by mouth every 8 (eight) hours as needed. 07/13/17   Merrily Brittleifenbark, Neil, MD  lidocaine (LIDODERM) 5 % Place 1 patch  onto the skin every 12 (twelve) hours. Remove & Discard patch within 12 hours or as directed by MD 12/15/17 12/15/18  Joni ReiningSmith, Ronald K, PA-C  meloxicam (MOBIC) 15 MG tablet Take 1 tablet (15 mg total) by mouth daily. 12/07/17   Lutricia Feiloemer, William P, PA-C  metoprolol succinate (TOPROL-XL) 100 MG 24 hr tablet Take 100 mg by mouth daily. Take with or immediately following a meal.    [provider]  montelukast (SINGULAIR) 10 MG tablet Take 10 mg by mouth at bedtime.    [provider]  naproxen (NAPROSYN) 500 MG tablet Take 1 tablet (500 mg total) by mouth 2 (two) times daily with a meal. 03/18/18   Jene EveryKinner, Ciera Beckum, MD  QUEtiapine (SEROQUEL) 400 MG tablet Take 800 mg by mouth at bedtime.    [provider]  traMADol (ULTRAM) 50 MG tablet Take 1 tablet (50 mg total) by mouth every 6 (six) hours as needed. 03/18/18 03/18/19  Jene EveryKinner, Haroun Cotham, MD  Vitamin D, Ergocalciferol, (DRISDOL) 50000 units CAPS capsule Take 50,000 Units by mouth every 7 (seven) days.    [provider]     Allergies Penicillins; Sulfa antibiotics; Corticosteroids; and Prednisone  Family History  Problem Relation Age of Onset  . Diabetes Mother   . Depression Mother   . Alzheimer's disease Mother   . Diabetes Father   . Glaucoma Father  Social History Social History   Tobacco Use  . Smoking status: Current Every Day Smoker    Packs/day: 0.50    Types: Cigarettes  . Smokeless tobacco: Never Used  Substance Use Topics  . Alcohol use: No  . Drug use: No    Review of Systems  Constitutional: No fever/chills Eyes: No visual changes.  ENT: No sore throat. Cardiovascular: Denies chest pain. Respiratory: Denies shortness of breath. Gastrointestinal: Left flank pain as above Genitourinary: Negative for dysuria.  Positive hematuria Musculoskeletal: Negative for back pain. Skin: Negative for rash. Neurological: Negative for headaches     ____________________________________________   PHYSICAL EXAM:  VITAL SIGNS: ED Triage Vitals [03/18/18 1530]  Enc Vitals Group     BP (!) 167/103     Pulse Rate (!) 108     Resp 17     Temp 98.3 F (36.8 C)     Temp Source Oral     SpO2 98 %     Weight 74.8 kg (165 lb)     Height 1.727 m (5\' 8" )     Head Circumference      Peak Flow      Pain Score 10     Pain Loc      Pain Edu?      Excl. in GC?     Constitutional: Alert and oriented Eyes: Conjunctivae are normal.   Nose: No congestion/rhinnorhea. Mouth/Throat: Mucous membranes are moist.    Cardiovascular: Normal rate, regular rhythm.  Good peripheral circulation. Respiratory: Normal respiratory effort.  No retractions.  Gastrointestinal: Soft and nontender. No distention.  No CVA tenderness.  Musculoskeletal:  Warm and well perfused Neurologic:  Normal speech and language. No gross focal neurologic deficits are appreciated.  Skin:  Skin is warm, dry and intact. No rash noted. Psychiatric: Mood and affect are normal. Speech and behavior are normal.  ____________________________________________   LABS (all labs ordered are listed, but only abnormal results are displayed)  Labs Reviewed  URINALYSIS, COMPLETE (UACMP) WITH MICROSCOPIC - Abnormal; Notable for the following components:      Result Value   Color, Urine RED (*)    APPearance CLOUDY (*)    Hgb urine dipstick LARGE (*)    Protein, ur 30 (*)    Leukocytes, UA TRACE (*)    RBC / HPF >50 (*)    All other components within normal limits  CBC - Abnormal; Notable for the following components:   WBC 13.1 (*)    RDW 17.1 (*)    All other components within normal limits  BASIC METABOLIC PANEL   ____________________________________________  EKG  None ____________________________________________  RADIOLOGY  CT renal stone study ____________________________________________   PROCEDURES  Procedure(s) performed:  No  Procedures   Critical Care performed: No ____________________________________________   INITIAL IMPRESSION / ASSESSMENT AND PLAN / ED COURSE  Pertinent labs & imaging results that were available during my care of the patient were reviewed by me and considered in my medical decision making (see chart for details).  Presents with left flank pain as described above, positive hematuria, strongly suspicious for ureterolithiasis, will give IV Toradol, IV Zofran, IV fluids, obtain CT renal stone study and reevaluate  Patient reports his pain is greatly improved  Workup is overall reassuring, CT scan negative for acute stone although presentation is suspicious for stone that he has now passed.  Went over CT results in depth including need for follow-up of possible bladder thickening and CT of the chest  ____________________________________________   FINAL CLINICAL IMPRESSION(S) / ED DIAGNOSES  Final diagnoses:  Flank pain        Note:  This document was prepared using Dragon voice recognition software and may include unintentional dictation errors.    Jene Every, MD 03/18/18 [phone]

## 2018-03-18 NOTE — ED Notes (Signed)
Patient transported to CT 

## 2018-03-18 NOTE — ED Notes (Signed)
Pt verbalizes understanding of d/c instructions, medications and follow up 

## 2018-03-18 NOTE — ED Triage Notes (Signed)
Pt states "Im passing a kidney stone". Pt c/o left sided pain since last night with N/V.Marland Kitchen. states he has a hx of the same.

## 2018-03-18 NOTE — ED Notes (Signed)
Pt reports a couple of days ago he started with pain in his back. Pt reports now it is radiating around his left flank and he is urinating blood. Pt reports feels like he is trying to pass a stone. Pt reports hx of the same and states had a partial nephrectomy due to a stone years ago. Pt denies urinary retention. Pt reports feels like the stone is stuck in his left flank.

## 2018-04-22 DIAGNOSIS — F319 Bipolar disorder, unspecified: Secondary | ICD-10-CM | POA: Insufficient documentation

## 2018-06-05 ENCOUNTER — Other Ambulatory Visit: Payer: Self-pay

## 2018-06-05 ENCOUNTER — Ambulatory Visit
Admission: EM | Admit: 2018-06-05 | Discharge: 2018-06-05 | Disposition: A | Discharge status: Home / Self Care | Payer: Medicare HMO | Attending: Family Medicine | Admitting: Family Medicine

## 2018-06-05 ENCOUNTER — Encounter: Payer: Self-pay | Admitting: Emergency Medicine

## 2018-06-05 DIAGNOSIS — I10 Essential (primary) hypertension: Secondary | ICD-10-CM

## 2018-06-05 DIAGNOSIS — F17210 Nicotine dependence, cigarettes, uncomplicated: Secondary | ICD-10-CM

## 2018-06-05 DIAGNOSIS — R059 Cough, unspecified: Secondary | ICD-10-CM

## 2018-06-05 DIAGNOSIS — R69 Illness, unspecified: Principal | ICD-10-CM

## 2018-06-05 DIAGNOSIS — R05 Cough: Secondary | ICD-10-CM

## 2018-06-05 DIAGNOSIS — J111 Influenza due to unidentified influenza virus with other respiratory manifestations: Secondary | ICD-10-CM

## 2018-06-05 LAB — RAPID INFLUENZA A&B ANTIGENS (ARMC ONLY)
INFLUENZA A (ARMC): NEGATIVE
INFLUENZA B (ARMC): NEGATIVE

## 2018-06-05 MED ORDER — OSELTAMIVIR PHOSPHATE 75 MG PO CAPS: 75.0000 mg | ORAL_CAPSULE | Freq: Two times a day (BID) | ORAL | 0 refills | Status: DC

## 2018-06-05 MED ORDER — HYDROCOD POLST-CPM POLST ER 10-8 MG/5ML PO SUER: 5.0000 mL | Freq: Two times a day (BID) | ORAL | 0 refills | Status: DC | PRN

## 2018-06-05 MED ORDER — AZITHROMYCIN 250 MG PO TABS: ORAL_TABLET | ORAL | 0 refills | Status: DC

## 2018-06-05 NOTE — ED Triage Notes (Signed)
Pt c/o cough, sore throat, body aches, fever (101.5), rib pain from coughing. Started about 3 days ago.

## 2018-06-05 NOTE — ED Provider Notes (Signed)
[facility]    CSN: [id number] Arrival date & time: 06/05/18  1007     History   Chief Complaint Chief Complaint  Patient presents with  . Cough  . Sore Throat    HPI James Bryan is a 58 y.o. male.   The history is provided by the patient.  Cough  Associated symptoms: fever, rhinorrhea and sore throat   Associated symptoms: no wheezing   Sore Throat   URI  Presenting symptoms: congestion, cough, fatigue, fever, rhinorrhea and sore throat   Severity:  Moderate Onset quality:  Sudden Duration:  2 days Timing:  Constant Progression:  Unchanged Chronicity:  New Relieved by:  Nothing Ineffective treatments:  OTC medications Associated symptoms: no wheezing   Risk factors: sick contacts  Chronic respiratory disease: unknown, however chronic smoker.     Past Medical History:  Diagnosis Date  . Coronary artery disease   . Hyperlipidemia   . Hypertension     There are no active problems to display for this patient.   Past Surgical History:  Procedure Laterality Date  . APPENDECTOMY    . JOINT REPLACEMENT Right    knee       Home Medications    Prior to Admission medications   Medication Sig Start Date End Date Taking? Authorizing Provider  aspirin EC 81 MG tablet Take 81 mg by mouth daily.   Yes [provider]  atorvastatin (LIPITOR) 40 MG tablet Take 40 mg by mouth daily.   Yes [provider]  clopidogrel (PLAVIX) 75 MG tablet Take 75 mg by mouth daily.   Yes [provider]  ibuprofen (ADVIL,MOTRIN) 600 MG tablet Take 1 tablet (600 mg total) by mouth every 8 (eight) hours as needed. 07/13/17  Yes Merrily Brittle, MD  metoprolol succinate (TOPROL-XL) 100 MG 24 hr tablet Take 100 mg by mouth daily. Take with or immediately following a meal.   Yes [provider]  montelukast (SINGULAIR) 10 MG tablet Take 10 mg by mouth at bedtime.   Yes [provider]  naproxen (NAPROSYN) 500 MG tablet Take 1  tablet (500 mg total) by mouth 2 (two) times daily with a meal. 03/18/18  Yes Jene Every, MD  QUEtiapine (SEROQUEL) 400 MG tablet Take 800 mg by mouth at bedtime.   Yes [provider]  traMADol (ULTRAM) 50 MG tablet Take 1 tablet (50 mg total) by mouth every 6 (six) hours as needed. 03/18/18 03/18/19 Yes Jene Every, MD  Vitamin D, Ergocalciferol, (DRISDOL) 50000 units CAPS capsule Take 50,000 Units by mouth every 7 (seven) days.   Yes [provider]  azithromycin (ZITHROMAX Z-PAK) 250 MG tablet 2 tabs po once day 1, then 1 tab po qd for next 4 days 06/05/18   Payton Mccallum, MD  chlorpheniramine-HYDROcodone [facility] PENNKINETIC ER) 10-8 MG/5ML SUER Take 5 mLs by mouth every 12 (twelve) hours as needed. 06/05/18   Payton Mccallum, MD  cyclobenzaprine (FLEXERIL) 10 MG tablet Take 1 tablet (10 mg total) by mouth 3 (three) times daily as needed for muscle spasms. 01/19/17   Payton Mccallum, MD  HYDROcodone-acetaminophen (NORCO/VICODIN) 5-325 MG tablet Take 1 tablet by mouth every 6 (six) hours as needed. 12/07/17   Lutricia Feil, PA-C  lidocaine (LIDODERM) 5 % Place 1 patch onto the skin every 12 (twelve) hours. Remove & Discard patch within 12 hours or as directed by MD 12/15/17 12/15/18  Joni Reining, PA-C  meloxicam (MOBIC) 15 MG tablet Take 1 tablet (  15 mg total) by mouth daily. 12/07/17   Lutricia Feil, PA-C  oseltamivir (TAMIFLU) 75 MG capsule Take 1 capsule (75 mg total) by mouth 2 (two) times daily. 06/05/18   Payton Mccallum, MD    Family History Family History  Problem Relation Age of Onset  . Diabetes Mother   . Depression Mother   . Alzheimer's disease Mother   . Diabetes Father   . Glaucoma Father     Social History Social History   Tobacco Use  . Smoking status: Current Every Day Smoker    Packs/day: 0.25    Types: Cigarettes  . Smokeless tobacco: Never Used  Substance Use Topics  . Alcohol use: No  . Drug use: No     Allergies    Penicillins; Sulfa antibiotics; Corticosteroids; and Prednisone   Review of Systems Review of Systems  Constitutional: Positive for fatigue and fever.  HENT: Positive for congestion, rhinorrhea and sore throat.   Respiratory: Positive for cough. Negative for wheezing.      Physical Exam Triage Vital Signs ED Triage Vitals  Enc Vitals Group     BP 06/05/18 1022 (!) 164/90     Pulse Rate 06/05/18 1022 (!) 105     Resp 06/05/18 1022 18     Temp 06/05/18 1022 98 F (36.7 C)     Temp Source 06/05/18 1022 Oral     SpO2 06/05/18 1022 99 %     Weight 06/05/18 1018 176 lb (79.8 kg)     Height 06/05/18 1018  (1.753 m)     Head Circumference --      Peak Flow --      Pain Score 06/05/18 1018 6     Pain Loc --      Pain Edu? --      Excl. in GC? --    No data found.  Updated Vital Signs BP (!) 164/90 (BP Location: Left Arm)   Pulse (!) 105   Temp 98 F (36.7 C) (Oral)   Resp 18   Ht  (1.753 m)   Wt 79.8 kg   SpO2 99%   BMI 25.99 kg/m   Visual Acuity Right Eye Distance:   Left Eye Distance:   Bilateral Distance:    Right Eye Near:   Left Eye Near:    Bilateral Near:     Physical Exam Vitals signs and nursing note reviewed.  Constitutional:      General: He is not in acute distress.    Appearance: He is well-developed. He is not diaphoretic.  HENT:     Head: Normocephalic and atraumatic.     Right Ear: Tympanic membrane, ear canal and external ear normal.     Left Ear: Tympanic membrane, ear canal and external ear normal.     Nose: Nose normal.     Mouth/Throat:     Pharynx: Uvula midline. No oropharyngeal exudate.     Tonsils: No tonsillar abscesses.  Eyes:     General: No scleral icterus.       Right eye: No discharge.        Left eye: No discharge.     Conjunctiva/sclera: Conjunctivae normal.     Pupils: Pupils are equal, round, and reactive to light.  Neck:     Musculoskeletal: Normal range of motion and neck supple.     Thyroid: No  thyromegaly.     Trachea: No tracheal deviation.  Cardiovascular:     Rate and Rhythm: Normal rate  and regular rhythm.     Heart sounds: Normal heart sounds.  Pulmonary:     Effort: Pulmonary effort is normal. No respiratory distress.     Breath sounds: No stridor. Rhonchi present. No wheezing or rales.  Chest:     Chest wall: No tenderness.  Lymphadenopathy:     Cervical: No cervical adenopathy.  Skin:    General: Skin is warm and dry.     Findings: No rash.  Neurological:     Mental Status: He is alert.      UC Treatments / Results  Labs (all labs ordered are listed, but only abnormal results are displayed) Labs Reviewed  RAPID INFLUENZA A&B ANTIGENS ([facility] ONLY)    EKG None  Radiology No results found.  Procedures Procedures (including critical care time)  Medications Ordered in UC Medications - No data to display  Initial Impression / Assessment and Plan / UC Course  I have reviewed the triage vital signs and the nursing notes.  Pertinent labs & imaging results that were available during my care of the patient were reviewed by me and considered in my medical decision making (see chart for details).      Final Clinical Impressions(s) / UC Diagnoses   Final diagnoses:  Influenza-like illness  Cough    ED Prescriptions    Medication Sig Dispense Auth. Provider   oseltamivir (TAMIFLU) 75 MG capsule Take 1 capsule (75 mg total) by mouth 2 (two) times daily. 10 capsule Annaliesa Blann, Pamala Hurry, MD   azithromycin (ZITHROMAX Z-PAK) 250 MG tablet 2 tabs po once day 1, then 1 tab po qd for next 4 days 6 each Payton Mccallum, MD   chlorpheniramine-HYDROcodone (TUSSIONEX PENNKINETIC ER) 10-8 MG/5ML SUER  (Status: Discontinued) Take 5 mLs by mouth every 12 (twelve) hours as needed. 60 mL Payton Mccallum, MD   chlorpheniramine-HYDROcodone (TUSSIONEX PENNKINETIC ER) 10-8 MG/5ML SUER Take 5 mLs by mouth every 12 (twelve) hours as needed. 60 mL Payton Mccallum, MD      1.  Labs/x-ray results and diagnosis reviewed with patient  2. rx as per orders above; reviewed possible side effects, interactions, risks and benefits  3. Recommend supportive treatment with rest, fluids 4. Follow-up prn if symptoms worsen or don't improve   Controlled Substance Prescriptions [place] Controlled Substance Registry consulted? Not Applicable   Payton Mccallum, MD 06/05/18 1146

## 2018-06-10 ENCOUNTER — Telehealth: Payer: Self-pay

## 2018-06-11 ENCOUNTER — Encounter: Payer: Self-pay | Admitting: Student in an Organized Health Care Education/Training Program

## 2018-06-11 ENCOUNTER — Ambulatory Visit
Payer: Medicare HMO | Attending: Student in an Organized Health Care Education/Training Program | Admitting: Student in an Organized Health Care Education/Training Program

## 2018-06-11 ENCOUNTER — Other Ambulatory Visit: Payer: Self-pay

## 2018-06-11 VITALS — BP 182/91 | HR 114 | Temp 97.7°F | Resp 16 | Ht 68.0 in | Wt 170.0 lb

## 2018-06-11 DIAGNOSIS — M5441 Lumbago with sciatica, right side: Secondary | ICD-10-CM | POA: Insufficient documentation

## 2018-06-11 DIAGNOSIS — Z9889 Other specified postprocedural states: Secondary | ICD-10-CM | POA: Insufficient documentation

## 2018-06-11 DIAGNOSIS — F319 Bipolar disorder, unspecified: Secondary | ICD-10-CM | POA: Diagnosis present

## 2018-06-11 DIAGNOSIS — M5442 Lumbago with sciatica, left side: Secondary | ICD-10-CM | POA: Diagnosis present

## 2018-06-11 DIAGNOSIS — M7502 Adhesive capsulitis of left shoulder: Secondary | ICD-10-CM | POA: Insufficient documentation

## 2018-06-11 DIAGNOSIS — I2510 Atherosclerotic heart disease of native coronary artery without angina pectoris: Secondary | ICD-10-CM | POA: Insufficient documentation

## 2018-06-11 DIAGNOSIS — G8929 Other chronic pain: Secondary | ICD-10-CM | POA: Diagnosis present

## 2018-06-11 DIAGNOSIS — M961 Postlaminectomy syndrome, not elsewhere classified: Secondary | ICD-10-CM

## 2018-06-11 DIAGNOSIS — G894 Chronic pain syndrome: Secondary | ICD-10-CM

## 2018-06-11 NOTE — Progress Notes (Signed)
Safety precautions to be maintained throughout the outpatient stay will include: orient to surroundings, keep bed in low position, maintain call bell within reach at all times, provide assistance with transfer out of bed and ambulation.  

## 2018-06-11 NOTE — Patient Instructions (Signed)
Pt to call after Psych eval to schedule 2nd pt visit

## 2018-06-11 NOTE — Progress Notes (Signed)
Patient's Name: James Bryan  MRN: [id number]  Referring Provider: Marygrace Drought, MD  DOB: 1960/10/17  PCP: Marygrace Drought, MD  DOS: 06/11/2018  Note by: Gillis Santa, MD  Service setting: Ambulatory outpatient  Specialty: Interventional Pain Management  Location: [facility] (AMB) Pain Management Facility  Visit type: Initial Patient Evaluation  Patient type: New Patient   Primary Reason(s) for Visit: Encounter for initial evaluation of one or more chronic problems (new to examiner) potentially causing chronic pain, and posing a threat to normal musculoskeletal function. (Level of risk: High) CC: Back Pain (low); Leg Pain (bilateral, left is worse); Hip Pain (bilateral, left is worse); and Knee Pain (bilateral, left is worse)  HPI  James Bryan is a 58 y.o. year old, male patient, who comes today to see [place] for the first time for an initial evaluation of his chronic pain. He has Adhesive capsulitis of left shoulder; Bipolar 1 disorder ([place]); Chronic back pain; Chronic knee pain after total replacement of right knee joint; and Coronary artery disease involving native coronary artery of native heart without angina pectoris on their problem list. Today he comes in for evaluation of his Back Pain (low); Leg Pain (bilateral, left is worse); Hip Pain (bilateral, left is worse); and Knee Pain (bilateral, left is worse)  Pain Assessment: Location: Right, Left Back Radiating: denies Onset: More than a month ago Duration:  >5 years Quality: Aching Severity: 7 /10 (subjective, self-reported pain score)  Note: Reported level is inconsistent with clinical observations.                         When using our objective Pain Scale, levels between 6 and 10/10 are said to belong in an emergency room, as it progressively worsens from a 6/10, described as severely limiting, requiring emergency care not usually available at an outpatient pain management facility. At a 6/10 level, communication becomes difficult and  requires great effort. Assistance to reach the emergency department may be required. Facial flushing and profuse sweating along with potentially dangerous increases in heart rate and blood pressure will be evident. Effect on ADL: "Its gotten to where ADL's are difficult"  Timing: Intermittent Modifying factors: soma at times, body pillow BP: (!) 182/91  HR: (!) 114  Onset and Duration: Present longer than 3 months Cause of pain: Unknown Severity: Getting worse, NAS-11 at its worse: 8/10, NAS-11 at its best: 5/10, NAS-11 now: 7/10 and NAS-11 on the average: 7/10 Timing: Not influenced by the time of the day Aggravating Factors: Bending, Climbing, Kneeling, Lifiting, Prolonged sitting, Prolonged standing, Squatting, Stooping , Surgery made it worse, Twisting and Walking uphill Alleviating Factors: Medications, Resting and Warm showers or baths Associated Problems: Night-time cramps, Depression, Fatigue, Spasms, Weakness, Pain that wakes patient up and Pain that does not allow patient to sleep Quality of Pain: Aching, Agonizing, Disabling, Throbbing and Toothache-like Previous Examinations or Tests: MRI scan Previous Treatments: The patient denies none listed  The patient comes into the clinics today for the first time for a chronic pain management evaluation.   (nurse present in room for duration of encounter- Kori)  58 year old male who presents with a chief complaint of low back pain with radiation to bilateral lower extremities.  Patient has had multiple right knee surgeries.  Patient also has a history of lumbar laminectomy.  The majority of the patient's pain is from his waist down.  It is worse with walking.  Patient was previously on Suboxone in [place].  He states that this was helpful for his pain management.  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  Patient does have coronary stent in place.  Patient also has a history of bipolar  1 disorder which is managed with Remeron.  Patient is currently on soma which she finds benefit from.  This is prescribed by his PCP.  Patient is tried steroid injections in the past which caused a severe rash.  This was in relation to both oral and injected steroids.  Patient is tried physical therapy in the past with limited benefit.  Patient is currently not on chronic opioid therapy.  His last opioid fill was 03/18/2018, tramadol 50 mg, quantity 20.  Today I took the time to provide the patient with information regarding my pain practice. The patient was informed that my practice is divided into two sections: an interventional pain management section, as well as a completely separate and distinct medication management section. I explained that I have procedure days for my interventional therapies, and evaluation days for follow-ups and medication management. Because of the amount of documentation required during both, they are kept separated. This means that there is the possibility that he may be scheduled for a procedure on one day, and medication management the next. I have also informed him that because of staffing and facility limitations, I no longer take patients for medication management only. To illustrate the reasons for this, I gave the patient the example of surgeons, and how inappropriate it would be to refer a patient to his/her care, just to write for the post-surgical antibiotics on a surgery done by a different surgeon.   Because interventional pain management is my board-certified specialty, the patient was informed that joining my practice means that they are open to any and all interventional therapies. I made it clear that this does not mean that they will be forced to have any procedures done. What this means is that I believe interventional therapies to be essential part of the diagnosis and proper management of chronic pain conditions. Therefore, patients not interested in these  interventional alternatives will be better served under the care of a different practitioner.  The patient was also made aware of my Comprehensive Pain Management Safety Guidelines where by joining my practice, they limit all of their nerve blocks and joint injections to those done by our practice, for as long as we are retained to manage their care.   Historic Controlled Substance Pharmacotherapy Review   06/05/2018  2   06/05/2018  Hydrocodone-Chlorphen Er Susp  60.00 6 Or Con   [id number]   [place] ([id number])   0  20.00 MME  Private Pay   [place]    Medications: The patient did not bring the medication(s) to the appointment, as requested in our "New Patient Package" Pharmacodynamics: Desired effects: Analgesia: The patient reports <50% benefit. Reported improvement in function: The patient reports medication allows him to accomplish basic ADLs. Clinically meaningful improvement in function (CMIF): Sustained CMIF goals met Perceived effectiveness: Described as relatively effective but with some room for improvement Undesirable effects: Side-effects or Adverse reactions: None reported Historical Monitoring: The patient  reports no history of drug use. List of all UDS Test(s): Lab Results  Component Value Date   ETH <10 07/13/2017   List of other Serum/Urine Drug Screening Test(s):  Lab Results  Component Value Date   ETH <10 07/13/2017   Historical Background Evaluation: [place] PMP:  Six (6) year initial data search conducted.             [place] Department of public safety, offender search: Editor, commissioning Information) Non-contributory Risk Assessment Profile: Aberrant behavior: None observed or detected today Risk factors for fatal opioid overdose: None identified today Fatal overdose hazard ratio (HR): Calculation deferred Non-fatal overdose hazard ratio (HR): Calculation deferred Risk of opioid abuse or dependence: 0.7-3.0% with doses ? 36 MME/day and 6.1-26% with doses ? 120 MME/day. Substance use  disorder (SUD) risk level: See below Personal History of Substance Abuse (SUD-Substance use disorder):  Alcohol: Negative  Illegal Drugs: Negative  Rx Drugs: Negative  ORT Risk Level calculation: Low Risk Opioid Risk Tool - 06/11/18 1101      Family History of Substance Abuse   Alcohol  Negative    Illegal Drugs  Negative    Rx Drugs  Negative      Personal History of Substance Abuse   Alcohol  Negative    Illegal Drugs  Negative    Rx Drugs  Negative      Age   Age between 79-45 years   No      History of Preadolescent Sexual Abuse   History of Preadolescent Sexual Abuse  Negative or Male      Psychological Disease   Psychological Disease  Negative    Depression  Positive      Total Score   Opioid Risk Tool Scoring  1    Opioid Risk Interpretation  Low Risk      ORT Scoring interpretation table:  Score <3 = Low Risk for SUD  Score between 4-7 = Moderate Risk for SUD  Score >8 = High Risk for Opioid Abuse   PHQ-2 Depression Scale:  Total score: 0  PHQ-2 Scoring interpretation table: (Score and probability of major depressive disorder)  Score 0 = No depression  Score 1 = 15.4% Probability  Score 2 = 21.1% Probability  Score 3 = 38.4% Probability  Score 4 = 45.5% Probability  Score 5 = 56.4% Probability  Score 6 = 78.6% Probability   PHQ-9 Depression Scale:  Total score: 0  PHQ-9 Scoring interpretation table:  Score 0-4 = No depression  Score 5-9 = Mild depression  Score 10-14 = Moderate depression  Score 15-19 = Moderately severe depression  Score 20-27 = Severe depression (2.4 times higher risk of SUD and 2.89 times higher risk of overuse)   Pharmacologic Plan: As per protocol, I have not taken over any controlled substance management, pending the results of ordered tests and/or consults.            Initial impression: Pending review of available data and ordered tests.  Meds   Current Outpatient Medications:  .  aspirin EC 81 MG tablet, Take 81 mg by  mouth daily., Disp: , Rfl:  .  atorvastatin (LIPITOR) 40 MG tablet, Take 40 mg by mouth daily., Disp: , Rfl:  .  azithromycin (ZITHROMAX Z-PAK) 250 MG tablet, 2 tabs po once day 1, then 1 tab po qd for next 4 days, Disp: 6 each, Rfl: 0 .  carisoprodol (SOMA) 250 MG tablet, Take by mouth., Disp: , Rfl:  .  clopidogrel (PLAVIX) 75 MG tablet, Take 75 mg by mouth daily., Disp: , Rfl:  .  cyclobenzaprine (FLEXERIL) 10 MG tablet, Take 1 tablet (10 mg total) by mouth 3 (three) times daily as needed for muscle spasms., Disp: 30 tablet, Rfl: 0 .  gabapentin (NEURONTIN) 600 MG tablet, TK 1  T PO BID AND 2 TS AT BEDTIME, Disp: , Rfl:  .  ibuprofen (ADVIL,MOTRIN) 600 MG tablet, Take 1 tablet (600 mg total) by mouth every 8 (eight) hours as needed., Disp: 30 tablet, Rfl: 0 .  meloxicam (MOBIC) 15 MG tablet, Take 1 tablet (15 mg total) by mouth daily., Disp: 30 tablet, Rfl: 0 .  metoprolol succinate (TOPROL-XL) 100 MG 24 hr tablet, Take 100 mg by mouth daily. Take with or immediately following a meal., Disp: , Rfl:  .  mirtazapine (REMERON) 30 MG tablet, , Disp: , Rfl:  .  montelukast (SINGULAIR) 10 MG tablet, Take 10 mg by mouth at bedtime., Disp: , Rfl:  .  naproxen (NAPROSYN) 500 MG tablet, Take 1 tablet (500 mg total) by mouth 2 (two) times daily with a meal., Disp: 20 tablet, Rfl: 2 .  QUEtiapine (SEROQUEL) 400 MG tablet, Take 800 mg by mouth at bedtime., Disp: , Rfl:  .  sildenafil (REVATIO) 20 MG tablet, Take three to five up to once daily as needed for erectile dysfunction., Disp: , Rfl:  .  traZODone (DESYREL) 50 MG tablet, , Disp: , Rfl:  .  Vitamin D, Ergocalciferol, (DRISDOL) 50000 units CAPS capsule, Take 50,000 Units by mouth every 7 (seven) days., Disp: , Rfl:  .  chlorpheniramine-HYDROcodone (TUSSIONEX PENNKINETIC ER) 10-8 MG/5ML SUER, Take 5 mLs by mouth every 12 (twelve) hours as needed. (Patient not taking: Reported on 06/11/2018), Disp: 60 mL, Rfl: 0 .  HYDROcodone-acetaminophen (NORCO/VICODIN)  5-325 MG tablet, Take 1 tablet by mouth every 6 (six) hours as needed. (Patient not taking: Reported on 06/11/2018), Disp: 6 tablet, Rfl: 0 .  lidocaine (LIDODERM) 5 %, Place 1 patch onto the skin every 12 (twelve) hours. Remove & Discard patch within 12 hours or as directed by MD (Patient not taking: Reported on 06/11/2018), Disp: 10 patch, Rfl: 0 .  oseltamivir (TAMIFLU) 75 MG capsule, Take 1 capsule (75 mg total) by mouth 2 (two) times daily. (Patient not taking: Reported on 06/11/2018), Disp: 10 capsule, Rfl: 0 .  traMADol (ULTRAM) 50 MG tablet, Take 1 tablet (50 mg total) by mouth every 6 (six) hours as needed. (Patient not taking: Reported on 06/11/2018), Disp: 20 tablet, Rfl: 0  Imaging Review  Cervical Imaging:  Results for orders placed during the hospital encounter of 03/07/18  MR SHOULDER LEFT WO CONTRAST   Narrative CLINICAL DATA:  Left shoulder pain and limited range of motion since a flu shoot November 22, 2017.  EXAM: MRI OF THE LEFT SHOULDER WITHOUT CONTRAST  TECHNIQUE: Multiplanar, multisequence MR imaging of the shoulder was performed. No intravenous contrast was administered.  COMPARISON:  Plain films of the left shoulder 12/07/2017.  FINDINGS: Rotator cuff: Mild, heterogeneously increased T2 signal is seen in the infraspinatus compatible with tendinopathy. The rotator cuff otherwise appears normal.  Muscles: Normal without atrophy or focal lesion.  Biceps long head: Intact and normal in appearance.  Acromioclavicular Joint: Mild degenerative disease is seen. Type I acromion. No subacromial/subdeltoid bursal fluid.  Glenohumeral Joint: Appears normal.  Labrum:  Intact.  Bones: No fracture or worrisome lesion. A well-circumscribed T2 hyperintense lesion in the proximal diaphysis of the femur measuring 0.8 cm in diameter is consistent with benign cyst.  Other: None.  IMPRESSION: Mild infraspinatus tendinopathy without tear.  Mild acromioclavicular  osteoarthritis.   Electronically Signed   By: Inge Rise M.D.   On: 03/07/2018 10:57     Shoulder-L DG:  Results for orders placed during the hospital encounter  of 12/07/17  DG Shoulder Left   Narrative CLINICAL DATA:  Patient with left shoulder pain and decreased range of motion.  EXAM: LEFT SHOULDER - 2+ VIEW  COMPARISON:  None.  FINDINGS: Normal anatomic alignment. No evidence for acute fracture or dislocation. Visualized left hemithorax unremarkable.  IMPRESSION: No acute osseous abnormality.   Electronically Signed   By: Lovey Newcomer M.D.   On: 12/07/2017 14:41     Results for orders placed during the hospital encounter of 07/13/17  DG Knee Complete 4 Views Right   Narrative CLINICAL DATA:  Recent seizure activity with right knee pain, initial encounter  EXAM: RIGHT KNEE - COMPLETE 4+ VIEW  COMPARISON:  None.  FINDINGS: Right knee prosthesis is noted. No acute fracture or dislocation is noted. No definitive loosening is seen. No joint effusion is noted.  IMPRESSION: Right knee prosthesis without acute abnormality.   Electronically Signed   By: Inez Catalina M.D.   On: 07/13/2017 20:37    Foot-R DG Complete:  Results for orders placed during the hospital encounter of 07/13/17  DG Foot Complete Right   Narrative CLINICAL DATA:  Seizure activity with foot pain, initial encounter  EXAM: RIGHT FOOT COMPLETE - 3+ VIEW  COMPARISON:  None.  FINDINGS: There is no evidence of fracture or dislocation. There is no evidence of arthropathy or other focal bone abnormality. Soft tissues are unremarkable.  IMPRESSION: No acute abnormality noted.   Electronically Signed   By: Inez Catalina M.D.   On: 07/13/2017 20:35     Complexity Note: Imaging results reviewed. Results shared with Mr. Callender, using Layman's terms.                         ROS  Cardiovascular: Heart attack ( Date: 2014) and Blood thinners:  Antiplatelet Pulmonary or  Respiratory: No reported pulmonary signs or symptoms such as wheezing and difficulty taking a deep full breath (Asthma), difficulty blowing air out (Emphysema), coughing up mucus (Bronchitis), persistent dry cough, or temporary stoppage of breathing during sleep Neurological: No reported neurological signs or symptoms such as seizures, abnormal skin sensations, urinary and/or fecal incontinence, being born with an abnormal open spine and/or a tethered spinal cord Review of Past Neurological Studies:  Results for orders placed or performed during the hospital encounter of 07/13/17  CT Head Wo Contrast   Narrative   CLINICAL DATA:  Recent seizure activity  EXAM: CT HEAD WITHOUT CONTRAST  TECHNIQUE: Contiguous axial images were obtained from the base of the skull through the vertex without intravenous contrast.  COMPARISON:  07/19/2008  FINDINGS: Brain: No evidence of acute infarction, hemorrhage, hydrocephalus, extra-axial collection or mass lesion/mass effect.  Vascular: No hyperdense vessel or unexpected calcification.  Skull: Normal. Negative for fracture or focal lesion.  Sinuses/Orbits: No acute finding.  Other: None.  IMPRESSION: Normal head CT   Electronically Signed   By: Inez Catalina M.D.   On: 07/13/2017 20:38    Psychological-Psychiatric: Depressed Gastrointestinal: No reported gastrointestinal signs or symptoms such as vomiting or evacuating blood, reflux, heartburn, alternating episodes of diarrhea and constipation, inflamed or scarred liver, or pancreas or irrregular and/or infrequent bowel movements Genitourinary: No reported renal or genitourinary signs or symptoms such as difficulty voiding or producing urine, peeing blood, non-functioning kidney, kidney stones, difficulty emptying the bladder, difficulty controlling the flow of urine, or chronic kidney disease Hematological: No reported hematological signs or symptoms such as prolonged bleeding, low or poor  functioning platelets,  bruising or bleeding easily, hereditary bleeding problems, low energy levels due to low hemoglobin or being anemic Endocrine: No reported endocrine signs or symptoms such as high or low blood sugar, rapid heart rate due to high thyroid levels, obesity or weight gain due to slow thyroid or thyroid disease Rheumatologic: No reported rheumatological signs and symptoms such as fatigue, joint pain, tenderness, swelling, redness, heat, stiffness, decreased range of motion, with or without associated rash Musculoskeletal: Negative for myasthenia gravis, muscular dystrophy, multiple sclerosis or malignant hyperthermia Work History: Working full time  Allergies  Mr. Rewerts is allergic to penicillins; sulfa antibiotics; corticosteroids; and prednisone.  Laboratory Chemistry  Inflammation Markers (CRP: Acute Phase) (ESR: Chronic Phase) No results found for: CRP, ESRSEDRATE, LATICACIDVEN                       Rheumatology Markers No results found for: RF, ANA, LABURIC, URICUR, LYMEIGGIGMAB, LYMEABIGMQN, HLAB27                      Renal Function Markers Lab Results  Component Value Date   BUN 10 03/18/2018   CREATININE 0.72 03/18/2018   GFRAA >60 03/18/2018   GFRNONAA >60 03/18/2018                             Hepatic Function Markers Lab Results  Component Value Date   AST 21 07/13/2017   ALT 10 (L) 07/13/2017   ALBUMIN 4.0 07/13/2017   ALKPHOS 145 (H) 07/13/2017                        Electrolytes Lab Results  Component Value Date   NA 136 03/18/2018   K 3.7 03/18/2018   CL 101 03/18/2018   CALCIUM 9.7 03/18/2018                        Neuropathy Markers No results found for: VITAMINB12, FOLATE, HGBA1C, HIV                      CNS Tests No results found for: COLORCSF, APPEARCSF, RBCCOUNTCSF, WBCCSF, POLYSCSF, LYMPHSCSF, EOSCSF, PROTEINCSF, GLUCCSF, JCVIRUS, CSFOLI, IGGCSF                      Bone Pathology Markers No results found for: VD25OH,  [id number], CH8527PO2, [id number], 25OHVITD1, 25OHVITD2, 25OHVITD3, TESTOFREE, TESTOSTERONE                       Coagulation Parameters Lab Results  Component Value Date   PLT 244 03/18/2018                        Cardiovascular Markers Lab Results  Component Value Date   TROPONINI <0.03 10/07/2017   HGB 13.9 03/18/2018   HCT 43.6 03/18/2018                         CA Markers No results found for: CEA, CA125, LABCA2                      Endocrine Markers No results found for: TSH, FREET4, TESTOFREE, TESTOSTERONE, ESTRADIOL, ESTRADIOLPCT, ESTRADIOLFRE                      Note:  Lab results reviewed.  [place]  Drug: Mr. [place]ton  reports no history of drug use. Alcohol:  reports no history of alcohol use. Tobacco:  reports that he has been smoking cigarettes. He has been smoking about 0.25 packs per day. He has never used smokeless tobacco. Medical:  has a past medical history of Coronary artery disease, Hyperlipidemia, Hypertension, and Myocardial infarction ([place]). Family: family history includes Alzheimer's disease in his mother; Depression in his mother; Diabetes in his father and mother; Glaucoma in his father.  Past Surgical History:  Procedure Laterality Date  . APPENDECTOMY    . JOINT REPLACEMENT Right    knee  . LUMBAR LAMINECTOMY  2012  . thumb surgery     reimplantation   Active Ambulatory Problems    Diagnosis Date Noted  . Adhesive capsulitis of left shoulder 12/11/2017  . Bipolar 1 disorder ([place]) 04/22/2018  . Chronic back pain 04/19/2017  . Chronic knee pain after total replacement of right knee joint 04/19/2017  . Coronary artery disease involving native coronary artery of native heart without angina pectoris 04/05/2017   Resolved Ambulatory Problems    Diagnosis Date Noted  . No Resolved Ambulatory Problems   Past Medical History:  Diagnosis Date  . Coronary artery disease   . Hyperlipidemia   . Hypertension   . Myocardial infarction [facility])     Constitutional Exam  General appearance: Well nourished, well developed, and well hydrated. In no apparent acute distress Vitals:   06/11/18 1048  BP: (!) 182/91  Pulse: (!) 114  Resp: 16  Temp: 97.7 F (36.5 C)  SpO2: 98%  Weight: 170 lb (77.1 kg)  Height: 5' 8"  (1.727 m)   BMI Assessment: Estimated body mass index is 25.85 kg/m as calculated from the following:   Height as of this encounter: 5' 8"  (1.727 m).   Weight as of this encounter: 170 lb (77.1 kg).  BMI interpretation table: BMI level Category Range association with higher incidence of chronic pain  <18 kg/m2 Underweight   18.5-24.9 kg/m2 Ideal body weight   25-29.9 kg/m2 Overweight Increased incidence by 20%  30-34.9 kg/m2 Obese (Class I) Increased incidence by 68%  35-39.9 kg/m2 Severe obesity (Class II) Increased incidence by 136%  >40 kg/m2 Extreme obesity (Class III) Increased incidence by 254%   Patient's current BMI Ideal Body weight  Body mass index is 25.85 kg/m. Ideal body weight: 68.4 kg (150 lb 12.7 oz) Adjusted ideal body weight: 71.9 kg (158 lb 7.6 oz)   BMI Readings from Last 4 Encounters:  06/11/18 25.85 kg/m  06/05/18 25.99 kg/m  03/18/18 25.09 kg/m  12/15/17 25.09 kg/m   Wt Readings from Last 4 Encounters:  06/11/18 170 lb (77.1 kg)  06/05/18 176 lb (79.8 kg)  03/18/18 165 lb (74.8 kg)  12/15/17 165 lb (74.8 kg)  Psych/Mental status: Alert, oriented x 3 (person, place, & time)       Eyes: PERLA Respiratory: No evidence of acute respiratory distress  Cervical Spine Area Exam  Skin & Axial Inspection: No masses, redness, edema, swelling, or associated skin lesions Alignment: Symmetrical Functional ROM: Unrestricted ROM      Stability: No instability detected Muscle Tone/Strength: Functionally intact. No obvious neuro-muscular anomalies detected. Sensory (Neurological): Unimpaired Palpation: No palpable anomalies              Upper Extremity (UE) Exam    Side: Right upper  extremity  Side: Left upper extremity  Skin & Extremity Inspection: Skin color, temperature, and hair growth  are WNL. No peripheral edema or cyanosis. No masses, redness, swelling, asymmetry, or associated skin lesions. No contractures.  Skin & Extremity Inspection: Skin color, temperature, and hair growth are WNL. No peripheral edema or cyanosis. No masses, redness, swelling, asymmetry, or associated skin lesions. No contractures.  Functional ROM: Unrestricted ROM          Functional ROM: Unrestricted ROM          Muscle Tone/Strength: Functionally intact. No obvious neuro-muscular anomalies detected.  Muscle Tone/Strength: Functionally intact. No obvious neuro-muscular anomalies detected.  Sensory (Neurological): Unimpaired          Sensory (Neurological): Unimpaired          Palpation: No palpable anomalies              Palpation: No palpable anomalies              Provocative Test(s):  Phalen's test: deferred Tinel's test: deferred Apley's scratch test (touch opposite shoulder):  Action 1 (Across chest): deferred Action 2 (Overhead): deferred Action 3 (LB reach): deferred   Provocative Test(s):  Phalen's test: deferred Tinel's test: deferred Apley's scratch test (touch opposite shoulder):  Action 1 (Across chest): deferred Action 2 (Overhead): deferred Action 3 (LB reach): deferred    Thoracic Spine Area Exam  Skin & Axial Inspection: No masses, redness, or swelling Alignment: Symmetrical Functional ROM: Unrestricted ROM Stability: No instability detected Muscle Tone/Strength: Functionally intact. No obvious neuro-muscular anomalies detected. Sensory (Neurological): Unimpaired Muscle strength & Tone: No palpable anomalies  Lumbar Spine Area Exam  Skin & Axial Inspection: Well healed scar from previous spine surgery detected Alignment: Symmetrical Functional ROM: Decreased ROM       Stability: No instability detected Muscle Tone/Strength: Functionally intact. No obvious  neuro-muscular anomalies detected. Sensory (Neurological): Dermatomal pain pattern Palpation: No palpable anomalies       Provocative Tests: Hyperextension/rotation test: (+) due to pain. Lumbar quadrant test (Kemp's test): deferred today       Lateral bending test: (+) ipsilateral radicular pain, bilaterally. Positive for bilateral foraminal stenosis. Patrick's Maneuver: (+) for bilateral S-I arthralgia             FABER* test: deferred today                   S-I anterior distraction/compression test: deferred today         S-I lateral compression test: deferred today         S-I Thigh-thrust test: deferred today         S-I Gaenslen's test: deferred today         *(Flexion, ABduction and External Rotation)  Gait & Posture Assessment  Ambulation: Unassisted Gait: Relatively normal for age and body habitus Posture: WNL   Lower Extremity Exam    Side: Right lower extremity  Side: Left lower extremity  Stability: No instability observed          Stability: No instability observed          Skin & Extremity Inspection: Evidence of prior arthroplastic surgery  Skin & Extremity Inspection: Skin color, temperature, and hair growth are WNL. No peripheral edema or cyanosis. No masses, redness, swelling, asymmetry, or associated skin lesions. No contractures.  Functional ROM: Decreased ROM for all joints of the lower extremity          Functional ROM: Decreased ROM for hip and knee joints          Muscle Tone/Strength: Functionally intact. No obvious neuro-muscular anomalies  detected.  Muscle Tone/Strength: Functionally intact. No obvious neuro-muscular anomalies detected.  Sensory (Neurological): Musculoskeletal pain pattern        Sensory (Neurological): Musculoskeletal pain pattern        DTR: Patellar: 0: absent Achilles: deferred today Plantar: deferred today  DTR: Patellar: 0: absent Achilles: deferred today Plantar: deferred today  Palpation: No palpable anomalies  Palpation: No  palpable anomalies    Assessment & Plan  Primary Diagnosis & Pertinent Problem List: The primary encounter diagnosis was Chronic pain syndrome. Diagnoses of History of lumbar laminectomy, Post laminectomy syndrome, Chronic bilateral low back pain with bilateral sciatica, Bipolar 1 disorder (HCC), Adhesive capsulitis of left shoulder, and Coronary artery disease involving native coronary artery of native heart without angina pectoris (on Plavix) were also pertinent to this visit.  Visit Diagnosis: 1. Chronic pain syndrome   2. History of lumbar laminectomy   3. Post laminectomy syndrome   4. Chronic bilateral low back pain with bilateral sciatica   5. Bipolar 1 disorder ([place])   6. Adhesive capsulitis of left shoulder   7. Coronary artery disease involving native coronary artery of native heart without angina pectoris (on Plavix)    Problems updated and reviewed during this visit: Problem  Bipolar 1 Disorder (Hcc)  Adhesive Capsulitis of Left Shoulder  Chronic Back Pain  Chronic Knee Pain After Total Replacement of Right Knee Joint  Coronary Artery Disease Involving Native Coronary Artery of Native Heart Without Angina Pectoris  General Recommendations: The pain condition that the patient suffers from is best treated with a multidisciplinary approach that involves an increase in physical activity to prevent de-conditioning and worsening of the pain cycle, as well as psychological counseling (formal and/or informal) to address the co-morbid psychological affects of pain. Treatment will often involve judicious use of pain medications and interventional procedures to decrease the pain, allowing the patient to participate in the physical activity that will ultimately produce long-lasting pain reductions. The goal of the multidisciplinary approach is to return the patient to a higher level of overall function and to restore their ability to perform activities of daily living.  58 year old male who  presents with a chief complaint of low back pain with radiation to bilateral lower extremities.  Patient has had multiple right knee surgeries.  Patient also has a history of lumbar laminectomy.  The majority of the patient's pain is from his waist down.  It is worse with walking.  Patient was previously on Suboxone in [place].  He states that this was helpful for his pain management.  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  Patient does have coronary stent in place.  Patient also has a history of bipolar 1 disorder which is managed with Remeron.  Patient is currently on soma which she finds benefit from.  This is prescribed by his PCP.  Patient is tried steroid injections in the past which caused a severe rash.  This was in relation to both oral and injected steroids.  Patient is tried physical therapy in the past with limited benefit.  Patient is currently not on chronic opioid therapy.  His last opioid fill was 03/18/2018, tramadol 50 mg, quantity 20.  Had extensive discussion with the patient about therapeutic options.  The patient is on Plavix and states that he has had adverse reactions to steroids in the past, both oral and injectables.  We will avoid interventional therapies to rely on steroids.  Patient  could be candidate for nerve blocks with local anesthetic only followed by radiofrequency ablation.  This will need to be considered in the context of him being on Plavix which is managed by his cardiologist in [place].  Patient states that he was on Suboxone in [place] which was helpful in managing his pain.  He is having difficulty finding a Suboxone provider here in the community.  While I informed the patient that I do not have the ability to prescribe Suboxone which requires a DEA X license, patient may be a candidate for buprenorphine therapy here.  Before considering him for buprenorphine therapy, I will obtain urine drug screen and have  the patient see psychiatry to evaluate for risk of substance abuse disorder.  I would like the psychiatrist to explore his psychiatric history in greater detail to better understand what led up to him being managed on Suboxone.  Pending UDS and psychological evaluation, patient could be a candidate for buprenorphine therapy either transdermal buprenorphine or buccal buprenorphine.  I also informed the patient that this would be the only opioid medication that he would be a candidate for here, I do not plan on prescribing any short acting opioid analgesics for his chronic pain.   Plan of Care (Initial workup plan)  Note: Please be advised that as per protocol, today's visit has been an evaluation only. We have not taken over the patient's controlled substance management.  Ordered Lab-work, Procedure(s), Referral(s), & Consult(s): Orders Placed This Encounter  Procedures  . Compliance Drug Analysis, Ur  . Ambulatory referral to Psychology   Pharmacological management options:  Opioid Analgesics: The patient was informed that there is no guarantee that he would be a candidate for opioid analgesics. The decision will be made following CDC guidelines. This decision will be based on the results of diagnostic studies, as well as Mr. Rinehimer's risk profile.  Buprenorphine only pending UDS and psych eval  Membrane stabilizer: To be determined at a later time  Muscle relaxant: To be determined at a later time  NSAID: To be determined at a later time  Other analgesic(s): To be determined at a later time   Interventional considerations: Lumbar spinal cord stimulation for postlaminectomy pain syndrome, bilateral genicular nerve block and possible RFA for bilateral knee osteoarthritis  Provider-requested follow-up: Return for After Psychological evaluation.  No future appointments.  Primary Care Physician: Marygrace Drought, MD Location: [facility] Note by: Gillis Santa,  M.D, Date: 06/11/2018; Time: 3:58 PM  Patient Instructions  Pt to call after Psych eval to schedule 2nd pt visit

## 2018-06-15 LAB — COMPLIANCE DRUG ANALYSIS, UR

## 2018-07-19 ENCOUNTER — Encounter

## 2018-07-19 ENCOUNTER — Encounter: Payer: Self-pay | Admitting: Psychiatry

## 2018-07-19 ENCOUNTER — Ambulatory Visit (INDEPENDENT_AMBULATORY_CARE_PROVIDER_SITE_OTHER): Payer: Medicare HMO | Admitting: Psychiatry

## 2018-07-19 ENCOUNTER — Other Ambulatory Visit: Payer: Self-pay

## 2018-07-19 DIAGNOSIS — G894 Chronic pain syndrome: Secondary | ICD-10-CM

## 2018-07-19 DIAGNOSIS — F4310 Post-traumatic stress disorder, unspecified: Secondary | ICD-10-CM | POA: Diagnosis not present

## 2018-07-19 DIAGNOSIS — Z008 Encounter for other general examination: Secondary | ICD-10-CM | POA: Diagnosis not present

## 2018-07-19 DIAGNOSIS — F3161 Bipolar disorder, current episode mixed, mild: Secondary | ICD-10-CM

## 2018-07-19 DIAGNOSIS — F410 Panic disorder [episodic paroxysmal anxiety] without agoraphobia: Secondary | ICD-10-CM

## 2018-07-19 MED ORDER — LAMOTRIGINE 25 MG PO TABS: 25.0000 mg | ORAL_TABLET | Freq: Every day | ORAL | 1 refills | Status: DC

## 2018-07-19 NOTE — Progress Notes (Signed)
Spoke with patient at 8:50 07/19/18. Reviewed and updated medical history, surgical history, allergies. Medication and pharmacy reviewed and updated. No vital signs taken due to phone visit.

## 2018-07-19 NOTE — Progress Notes (Signed)
Virtual Visit via Video Note  I connected with James Bryan on 07/19/18 at  9:00 AM EDT by a video enabled telemedicine application and verified that I am speaking with the correct person using two identifiers.   I discussed the limitations of evaluation and management by telemedicine and the availability of in person appointments. The patient expressed understanding and agreed to proceed.    I discussed the assessment and treatment plan with the patient. The patient was provided an opportunity to ask questions and all were answered. The patient agreed with the plan and demonstrated an understanding of the instructions.   The patient was advised to call back or seek an in-person evaluation if the symptoms worsen or if the condition fails to improve as anticipated.    Psychiatric Initial Adult Assessment   Patient Identification: James Bryan MRN:  [id number] Date of Evaluation:  07/19/2018 Referral Source: Edward Jolly MD Chief Complaint:   Chief Complaint    Establish Care; Psychiatric Evaluation     Visit Diagnosis:    ICD-10-CM   1. Evaluation by psychiatric service required Z00.8   2. Chronic pain syndrome G89.4   3. Bipolar 1 disorder, mixed, mild (HCC) F31.61 lamoTRIgine (LAMICTAL) 25 MG tablet  4. PTSD (post-traumatic stress disorder) F43.10   5. Panic attacks F41.0     History of Present Illness:  James Bryan is a 58 year old Caucasian male, retired, lives in [place], engaged, has a history of bipolar disorder, PTSD, panic attacks, chronic pain, chronic knee pain after total replacement of right knee joint, coronary artery disease, was evaluated by telemedicine today.  Patient was referred by his pain management provider for evaluation for the risk of substance abuse prior to initiating pain management.  Patient however reports he also wants to establish care with [place] since he currently does not have a psychiatrist and he struggles with bipolar disorder, PTSD as well as  panic attacks.  Patient reports he has been in a lot of low back pain with radiation to bilateral lower extremities.  Patient reports he has had multiple right knee surgeries.  Patient also has a history of lumbar laminectomy.  Patient reports he was previously on Suboxone in [place].  Patient reports the only reason he was on Suboxone was because it was very difficult to find a pain specialist in [place] and his psychiatrist prescribed it to him Suboxone for his chronic pain.  Patient reports the Suboxone was very helpful with his pain and he was on it for more than 6 years.  He reports he was on Suboxone from 2010 - 2018.  He reports when he moved from [place] to [place] around 2018 his primary care provider here could not prescribe him Suboxone and hence he decided to stop taking it.  He reports that he never had any withdrawal symptoms when he stopped taking it.  He reports he has tried taking oxycodone previously for his pain however he became very nauseous when he takes these kind of opioid medications and hence does not want to be on them.    Patient reports that he does not abuse any substances.  He reports he uses alcohol once or twice a year-socially.  Patient reports a history of bipolar disorder.  He describes his bipolar symptoms as having mood symptoms like mood swings, irritability, going through episodes of sadness, inability to concentrate, anger issues as well as sleep problems.  Patient reports he is currently taking Seroquel, gabapentin as well as mirtazapine  for his bipolar disorder.  He reports these medications as helpful so far.  He however reports since the COVID-19 crisis started he has been having some anxiety symptoms and mood lability.  He hence feels that he needs a few medication changes.  Patient reports a history of PTSD.  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  Patient also reports other traumatic  history.  He reports his 49-month-old grandson died in his sleep under his care which later was found to be SIDS.  Patient reports that was very traumatic for him.  He also reports that his sister committed suicide several years ago.  His sister was going through multiple medical problems, had at least 2 kidney transplants and was in a lot of pain.  Patient reports she committed suicide because of the pain and none of the medications was controlling it for her.  Patient denies any suicidality in himself.  He reports he was raised Catholic and it is against his religion to do that.  Patient denies any homicidality.  Patient denies any perceptual disturbances.  Patient does report some panic attacks when he has racing heart rate, inability to breathe, feeling of impending doom and so on which can happen out of the blue.  He reports he has been having more and more panic attacks recently since the COVID-19 outbreak.  Patient has been trying to rearrange his lifestyle by doing relaxation techniques, keeping himself occupied and things like that which has been helpful so far.  Patient does struggle with sleep.  He reports it is more so because of his pain.  He is on Seroquel which he takes at bedtime.  He reports he was on Seroquel 600 mg previously and currently takes only 300 mg , since the 600 mg was giving him side effects.  He reports if his pain can be controlled his sleep would also get better.  Patient has good social support system from his partner.  He reports he is engaged to be married in August.  Patient does have a daughter, however reports they are not on talking terms.  Patient reports that he is interested in psychotherapy sessions.  Discussed referral to a therapist here in clinic.  Patient is interested in medication changes as well as psychotherapy sessions.    Associated Signs/Symptoms: Depression Symptoms:  depressed mood, anhedonia, insomnia, psychomotor agitation, psychomotor  retardation, fatigue, feelings of worthlessness/guilt, difficulty concentrating, anxiety, disturbed sleep,- all these symptoms are currently stable on medications  (Hypo) Manic Symptoms:  Distractibility, Elevated Mood, Impulsivity, Irritable Mood, Labiality of Mood,- currently all these symptoms are stable on medications Anxiety Symptoms:  Excessive Worry, Panic Symptoms,- has some anxiety due to pain and covid 19  Psychotic Symptoms:  Denies PTSD Symptoms: Had a traumatic exposure:  as noted above Re-experiencing:  Intrusive Thoughts Nightmares Hypervigilance:  Yes Avoidance:  Decreased Interest/Participation Foreshortened Future- PTSD sx - per history - but more so less stable   Past Psychiatric History: Patient denies inpatient mental health admissions.  Patient was under the care of her psychiatrist in [place] -Dr. Fara Boros.  Patient after coming to [place] was following up with his primary medical doctor for medication management.  Patient denies any history of suicide attempts.  Previous Psychotropic Medications: Yes Patient reports being on medications like Seroquel, trazodone, Ativan, mirtazapine, gabapentin  Substance Abuse History in the last 12 months:  No.  Consequences of Substance Abuse: Negative  Past Medical  History:  Past Medical History:  Diagnosis Date  . Anxiety   . Coronary artery disease   . Depression   . Hyperlipidemia   . Hypertension   . Myocardial infarction (HCC)   . PTSD (post-traumatic stress disorder)     Past Surgical History:  Procedure Laterality Date  . APPENDECTOMY    . JOINT REPLACEMENT Right    knee  . LUMBAR LAMINECTOMY  2012  . thumb surgery     reimplantation    Family Psychiatric History: Sister-mental illness- committed suicide.  Family History:  Family History  Problem Relation Age of Onset  . Diabetes Mother   . Depression Mother   . Alzheimer's disease Mother   . Diabetes Father   . Glaucoma Father      Social History:   Social History   Socioeconomic History  . Marital status: Single    Spouse name: Not on file  . Number of children: Not on file  . Years of education: Not on file  . Highest education level: Not on file  Occupational History  . Not on file  Social Needs  . Financial resource strain: Not on file  . Food insecurity:    Worry: Not on file    Inability: Not on file  . Transportation needs:    Medical: Not on file    Non-medical: Not on file  Tobacco Use  . Smoking status: Current Every Day Smoker    Packs/day: 0.25    Types: Cigarettes  . Smokeless tobacco: Never Used  Substance and Sexual Activity  . Alcohol use: No  . Drug use: No  . Sexual activity: Yes  Lifestyle  . Physical activity:    Days per week: Not on file    Minutes per session: Not on file  . Stress: Not on file  Relationships  . Social connections:    Talks on phone: Not on file    Gets together: Not on file    Attends religious service: Not on file    Active member of club or organization: Not on file    Attends meetings of clubs or organizations: Not on file    Relationship status: Not on file  Other Topics Concern  . Not on file  Social History Narrative  . Not on file    Additional Social History: Patient reports he is engaged, to be married in August of this year.  Patient currently lives in [place].  Patient has a biological daughter who is 51 years old.  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  Allergies:   Allergies  Allergen Reactions  . Penicillins Anaphylaxis  . Sulfa Antibiotics Anaphylaxis  . Corticosteroids   . Meperidine     Other reaction(s): Unknown  . Prednisone     Metabolic Disorder Labs: No results found for: HGBA1C, MPG No results found for: PROLACTIN No results found for: CHOL, TRIG, HDL, CHOLHDL, VLDL, LDLCALC No results found for: TSH  Therapeutic Level Labs: No results  found for: LITHIUM No results found for: CBMZ No results found for: VALPROATE  Current Medications: Current Outpatient Medications  Medication Sig Dispense Refill  . ketorolac (TORADOL) 10 MG tablet Take by mouth.    Marland Kitchen aspirin EC 81 MG tablet Take 81 mg by mouth daily.    Marland Kitchen atorvastatin (LIPITOR) 40 MG tablet Take 40 mg by mouth daily.    . chlorpheniramine-HYDROcodone (TUSSIONEX PENNKINETIC ER) 10-8 MG/5ML  SUER Take 5 mLs by mouth every 12 (twelve) hours as needed. (Patient not taking: Reported on 06/11/2018) 60 mL 0  . clopidogrel (PLAVIX) 75 MG tablet Take 75 mg by mouth daily.    Marland Kitchen. gabapentin (NEURONTIN) 600 MG tablet TK 1 T PO BID AND 2 TS AT BEDTIME    . HYDROcodone-acetaminophen (NORCO/VICODIN) 5-325 MG tablet Take 1 tablet by mouth every 6 (six) hours as needed. (Patient not taking: Reported on 06/11/2018) 6 tablet 0  . ibuprofen (ADVIL,MOTRIN) 600 MG tablet Take 1 tablet (600 mg total) by mouth every 8 (eight) hours as needed. 30 tablet 0  . lamoTRIgine (LAMICTAL) 25 MG tablet Take 1 tablet (25 mg total) by mouth daily. For mood 30 tablet 1  . lidocaine (LIDODERM) 5 % Place 1 patch onto the skin every 12 (twelve) hours. Remove & Discard patch within 12 hours or as directed by MD (Patient not taking: Reported on 06/11/2018) 10 patch 0  . metoprolol succinate (TOPROL-XL) 100 MG 24 hr tablet Take 100 mg by mouth daily. Take with or immediately following a meal.    . mirtazapine (REMERON) 30 MG tablet     . montelukast (SINGULAIR) 10 MG tablet Take 10 mg by mouth at bedtime.    . naproxen (NAPROSYN) 500 MG tablet Take 1 tablet (500 mg total) by mouth 2 (two) times daily with a meal. 20 tablet 2  . oseltamivir (TAMIFLU) 75 MG capsule Take 1 capsule (75 mg total) by mouth 2 (two) times daily. (Patient not taking: Reported on 06/11/2018) 10 capsule 0  . QUEtiapine (SEROQUEL) 300 MG tablet quetiapine 300 mg tablet    . sildenafil (REVATIO) 20 MG tablet Take three to five up to once daily as  needed for erectile dysfunction.    . traMADol (ULTRAM) 50 MG tablet Take 1 tablet (50 mg total) by mouth every 6 (six) hours as needed. (Patient not taking: Reported on 06/11/2018) 20 tablet 0  . Vitamin D, Ergocalciferol, (DRISDOL) 50000 units CAPS capsule Take 50,000 Units by mouth every 7 (seven) days.     No current facility-administered medications for this visit.     Musculoskeletal: Strength & Muscle Tone: UTA Gait & Station: UTA Patient leans: N/A  Psychiatric Specialty Exam: Review of Systems  Musculoskeletal: Positive for back pain.  Psychiatric/Behavioral: The patient is nervous/anxious and has insomnia.   All other systems reviewed and are negative.   There were no vitals taken for this visit.There is no height or weight on file to calculate BMI.  General Appearance: Casual  Eye Contact:  Fair  Speech:  Clear and Coherent  Volume:  Normal  Mood:  Anxious  Affect:  Appropriate  Thought Process:  Goal Directed and Descriptions of Associations: Intact  Orientation:  Full (Time, Place, and Person)  Thought Content:  Logical  Suicidal Thoughts:  No  Homicidal Thoughts:  No  Memory:  Immediate;   Fair Recent;   Fair Remote;   Fair  Judgement:  Fair  Insight:  Fair  Psychomotor Activity:  Normal  Concentration:  Concentration: Fair and Attention Span: Fair  Recall:  FiservFair  Fund of Knowledge:Fair  Language: Fair  Akathisia:  No  Handed:  Right  AIMS (if indicated) - UTA   Assets:  Communication Skills Desire for Improvement Social Support  ADL's:  Intact  Cognition: WNL  Sleep:  restless due to pain   Screenings: PHQ2-9     [facility]/11/2018 [facility]  PHQ-2 Total Score  0      Assessment and Plan: Blu is a 58 year old Caucasian male, engaged, retired, lives in [place], has a history of bipolar disorder, PTSD, panic attacks, chronic pain, multiple surgeries, coronary artery disease was evaluated by  telemedicine today.  Patient was referred to the clinic by his pain management provider to rule out risk of substance abuse prior to initiation of pain management.  Patient is biologically predisposed given his multiple medical problems, chronic pain as well as history of trauma.  Patient has psychosocial stressors of chronic pain as well as relationship struggles with his daughter.  Patient currently denies any suicidality and has good social support system from his partner.  Patient is a good candidate for medication management for his mental health problems as well as psychotherapy sessions and he is motivated to do so.  Plan as noted below.  Plan The following instruments were used Clinical interview Screener and opioid assessment for patient with pain/revised Opioid risk tool Drug abuse screening test Alcohol use disorder identification test PHQ 9 GAD 7 Mood disorder questionnaire   Based on clinical interview and instrument used at the time of evaluation the risk for substance abuse is determined to be low.  For bipolar disorder -stable Continue Seroquel 300 mg p.o. nightly Continue mirtazapine 30 mg p.o. nightly Patient also takes gabapentin which is also a mood stabilizer.  For panic attacks-unstable Patient reports worsening anxiety symptoms since the COVID-19 outbreak. We will refer him for CBT with therapist here in clinic. Start Lamictal 25 mg p.o. daily. Provided medication education including the risk of Stevens-Johnson syndrome.  For PTSD- some progress Refer for CBT.  Patient continues to have some residual symptoms.  For insomnia- restless due to pain. Patient will benefit from pain management. Continue melatonin.  Patient will benefit from the following labs-TSH, lipid panel, hemoglobin A1c, prolactin if not already done.  Follow-up in clinic in 4 weeks or sooner if needed.  I have spent atleast 45 minutes non face to face with patient today. More than 50 % of  the time was spent for psychoeducation and supportive psychotherapy and care coordination.  This note was generated in part or whole with voice recognition software. Voice recognition is usually quite accurate but there are transcription errors that can and very often do occur. I apologize for any typographical errors that were not detected and corrected.        Jomarie Longs, MD 4/24/202012:49 PM

## 2018-07-22 ENCOUNTER — Telehealth: Payer: Self-pay

## 2018-07-22 NOTE — Telephone Encounter (Signed)
Left message to stop lamictal if having side effects

## 2018-07-22 NOTE — Telephone Encounter (Signed)
pt called left message that he thinks he is having issues with his medication. gabapentin and his other medications

## 2018-07-25 ENCOUNTER — Encounter: Payer: Self-pay | Admitting: Psychiatry

## 2018-07-25 ENCOUNTER — Ambulatory Visit (INDEPENDENT_AMBULATORY_CARE_PROVIDER_SITE_OTHER): Payer: Medicare HMO | Admitting: Licensed Clinical Social Worker

## 2018-07-25 ENCOUNTER — Other Ambulatory Visit: Payer: Self-pay

## 2018-07-25 ENCOUNTER — Ambulatory Visit (INDEPENDENT_AMBULATORY_CARE_PROVIDER_SITE_OTHER): Payer: Medicare HMO | Admitting: Psychiatry

## 2018-07-25 DIAGNOSIS — F3161 Bipolar disorder, current episode mixed, mild: Secondary | ICD-10-CM | POA: Diagnosis not present

## 2018-07-25 DIAGNOSIS — F4310 Post-traumatic stress disorder, unspecified: Secondary | ICD-10-CM

## 2018-07-25 DIAGNOSIS — F410 Panic disorder [episodic paroxysmal anxiety] without agoraphobia: Secondary | ICD-10-CM

## 2018-07-25 MED ORDER — LITHIUM CARBONATE 150 MG PO CAPS: 150.0000 mg | ORAL_CAPSULE | Freq: Every day | ORAL | 1 refills | Status: DC

## 2018-07-25 NOTE — Progress Notes (Signed)
Virtual Visit via Telephone Note  I connected with James Bryan on 07/25/18 at 11:00 AM EDT by telephone and verified that I am speaking with the correct person using two identifiers.   I discussed the limitations, risks, security and privacy concerns of performing an evaluation and management service by telephone and the availability of in person appointments. I also discussed with the patient that there may be a patient responsible charge related to this service. The patient expressed understanding and agreed to proceed.    I discussed the assessment and treatment plan with the patient. The patient was provided an opportunity to ask questions and all were answered. The patient agreed with the plan and demonstrated an understanding of the instructions.   The patient was advised to call back or seek an in-person evaluation if the symptoms worsen or if the condition fails to improve as anticipated.   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  Patient today called requesting an appointment due to having medication problems.  Patient reports that after he started the Lamictal he started having muscle spasms of the back of his neck.  He hence decided to stop taking the Lamictal for a few days.  He reports that has since resolved.  He reports he hence believes that his Lamictal could have contributed to his muscle spasms.  Patient reports he continues to struggle with anxiety symptoms as well as mood lability.  He is interested in a mood stabilizer.  He reports his sister who has the same problem is on lithium.  Patient is interested in starting lithium today.   Discussed adding a small dosage.  Patient reports he otherwise is compliant on his medications as prescribed.  He reports sleep continues to be affected by pain.  He however takes Seroquel which helps to some extent.  Patient denies any suicidality, homicidality or perceptual disturbances.  Patient appeared to be alert oriented to person place and situation.   Visit Diagnosis:    ICD-10-CM   1. Bipolar 1 disorder, mixed, mild (HCC) F31.61 lithium carbonate 150 MG capsule  2. PTSD (post-traumatic stress disorder) F43.10   3. Panic attacks F41.0     Past Psychiatric History: Reviewed past psychiatric history from my progress note on 07/19/2018.  Past trials of Seroquel, trazodone, Ativan, mirtazapine, gabapentin  Past Medical History:  Past Medical History:  Diagnosis Date  . Anxiety   . Coronary artery disease   . Depression   . Hyperlipidemia   . Hypertension   . Myocardial infarction (HCC)   . PTSD (post-traumatic stress disorder)     Past Surgical History:  Procedure Laterality Date  . APPENDECTOMY    . JOINT REPLACEMENT Right    knee  . LUMBAR LAMINECTOMY  2012  . thumb surgery     reimplantation    Family Psychiatric History: Reviewed family psychiatric history from my progress note on 07/19/2018  Family History:  Family History  Problem Relation Age of Onset  . Diabetes Mother   . Depression Mother   . Alzheimer's disease Mother   . Diabetes Father   . Glaucoma Father  Social History: I have reviewed social history from my progress note on 07/19/2018. Social History   Socioeconomic History  . Marital status: Single    Spouse name: Not on file  . Number of children: Not on file  . Years of education: Not on file  . Highest education level: Not on file  Occupational History  . Not on file  Social Needs  . Financial resource strain: Not on file  . Food insecurity:    Worry: Not on file    Inability: Not on file  . Transportation needs:     Medical: Not on file    Non-medical: Not on file  Tobacco Use  . Smoking status: Current Every Day Smoker    Packs/day: 0.25    Types: Cigarettes  . Smokeless tobacco: Never Used  Substance and Sexual Activity  . Alcohol use: No  . Drug use: No  . Sexual activity: Yes  Lifestyle  . Physical activity:    Days per week: Not on file    Minutes per session: Not on file  . Stress: Not on file  Relationships  . Social connections:    Talks on phone: Not on file    Gets together: Not on file    Attends religious service: Not on file    Active member of club or organization: Not on file    Attends meetings of clubs or organizations: Not on file    Relationship status: Not on file  Other Topics Concern  . Not on file  Social History Narrative  . Not on file    Allergies:  Allergies  Allergen Reactions  . Penicillins Anaphylaxis  . Sulfa Antibiotics Anaphylaxis  . Corticosteroids   . Meperidine     Other reaction(s): Unknown  . Prednisone     Metabolic Disorder Labs: No results found for: HGBA1C, MPG No results found for: PROLACTIN No results found for: CHOL, TRIG, HDL, CHOLHDL, VLDL, LDLCALC No results found for: TSH  Therapeutic Level Labs: No results found for: LITHIUM No results found for: VALPROATE No components found for:  CBMZ  Current Medications: Current Outpatient Medications  Medication Sig Dispense Refill  . aspirin EC 81 MG tablet Take 81 mg by mouth daily.    Marland Kitchen atorvastatin (LIPITOR) 40 MG tablet Take 40 mg by mouth daily.    . chlorpheniramine-HYDROcodone (TUSSIONEX PENNKINETIC ER) 10-8 MG/5ML SUER Take 5 mLs by mouth every 12 (twelve) hours as needed. (Patient not taking: Reported on 06/11/2018) 60 mL 0  . clopidogrel (PLAVIX) 75 MG tablet Take 75 mg by mouth daily.    Marland Kitchen gabapentin (NEURONTIN) 600 MG tablet TK 1 T PO BID AND 2 TS AT BEDTIME    . HYDROcodone-acetaminophen (NORCO/VICODIN) 5-325 MG tablet Take 1 tablet by mouth every 6 (six) hours as  needed. (Patient not taking: Reported on 06/11/2018) 6 tablet 0  . ibuprofen (ADVIL,MOTRIN) 600 MG tablet Take 1 tablet (600 mg total) by mouth every 8 (eight) hours as needed. 30 tablet 0  . lidocaine (LIDODERM) 5 % Place 1 patch onto the skin every 12 (twelve) hours. Remove & Discard patch within 12 hours or as directed by MD (Patient not taking: Reported on 06/11/2018) 10 patch 0  . lithium carbonate 150 MG capsule Take 1 capsule (150 mg total) by mouth daily with supper. 30 capsule 1  . metoprolol succinate (TOPROL-XL) 100 MG 24 hr tablet Take 100 mg by mouth daily. Take with or immediately following a meal.    .  mirtazapine (REMERON) 30 MG tablet     . montelukast (SINGULAIR) 10 MG tablet Take 10 mg by mouth at bedtime.    . naproxen (NAPROSYN) 500 MG tablet Take 1 tablet (500 mg total) by mouth 2 (two) times daily with a meal. 20 tablet 2  . oseltamivir (TAMIFLU) 75 MG capsule Take 1 capsule (75 mg total) by mouth 2 (two) times daily. (Patient not taking: Reported on 06/11/2018) 10 capsule 0  . QUEtiapine (SEROQUEL) 300 MG tablet quetiapine 300 mg tablet    . sildenafil (REVATIO) 20 MG tablet Take three to five up to once daily as needed for erectile dysfunction.    . traMADol (ULTRAM) 50 MG tablet Take 1 tablet (50 mg total) by mouth every 6 (six) hours as needed. (Patient not taking: Reported on 06/11/2018) 20 tablet 0  . Vitamin D, Ergocalciferol, (DRISDOL) 50000 units CAPS capsule Take 50,000 Units by mouth every 7 (seven) days.     No current facility-administered medications for this visit.      Musculoskeletal: Strength & Muscle Tone: UTA Gait & Station: normal Patient leans: N/A  Psychiatric Specialty Exam: Review of Systems  Musculoskeletal: Positive for back pain and joint pain.  Psychiatric/Behavioral: The patient is nervous/anxious and has insomnia.   All other systems reviewed and are negative.   There were no vitals taken for this visit.There is no height or weight on  file to calculate BMI.  General Appearance: Casual  Eye Contact:  Good  Speech:  Clear and Coherent  Volume:  Normal  Mood:  Anxious  Affect:  Appropriate  Thought Process:  Goal Directed and Descriptions of Associations: Intact  Orientation:  Full (Time, Place, and Person)  Thought Content: Logical   Suicidal Thoughts:  No  Homicidal Thoughts:  No  Memory:  Immediate;   Fair Recent;   Fair Remote;   Fair  Judgement:  Fair  Insight:  Good  Psychomotor Activity:  Normal  Concentration:  Concentration: Fair and Attention Span: Fair  Recall:  [employer] of Knowledge: Fair  Language: Fair  Akathisia:  No  Handed:  Right  AIMS (if indicated): Patient currently denies any tremors or rigidity stiffness  Assets:  Communication Skills Desire for Improvement Social Support  ADL's:  Intact  Cognition: WNL  Sleep:  Fair   Screenings: PHQ2-9     [facility] Visit from 06/11/2018 in [facility]  PHQ-2 Total Score  0       Assessment and Plan: James Bryan is a 58 year old Caucasian male, engaged, retired, lives in [place], has a history of bipolar disorder, PTSD, panic attacks, chronic pain, multiple surgeries, coronary artery disease was evaluated by phone today.  Patient is biologically predisposed given his multiple medical problems, chronic pain as well as history of trauma.  Patient with psychosocial stressors of relationship struggles with her daughter.  Patient return to clinic due to having medication problems.  Discussed plan as noted below.  Plan  For bipolar disorder- some worsening Continue Seroquel 300 mg p.o. nightly Continue mirtazapine 30 mg p.o. nightly Discontinue Lamictal for side effects Add lithium 150 mg p.o. daily with supper. Provided him lab slip to go to [facility] in a week after starting medication.  We will get lithium levels done.  For panic attacks-unstable Seroquel and mirtazapine as prescribed. Patient has been  referred for CBT.  For PTSD-improving Referred for CBT.  For insomnia-restless due to pain. Patient will continue to benefit from pain management.  Follow-up  in clinic in 2 weeks or sooner if needed.  I have spent atleast 25 minutes non face to face with patient today. More than 50 % of the time was spent for psychoeducation and supportive psychotherapy and care coordination.  This note was generated in part or whole with voice recognition software. Voice recognition is usually quite accurate but there are transcription errors that can and very often do occur. I apologize for any typographical errors that were not detected and corrected.        Jomarie LongsSaramma Dalyah Pla, MD 07/25/2018, 5:52 PM

## 2018-07-25 NOTE — Patient Instructions (Signed)
Lithium tablets or capsules What is this medicine? LITHIUM (LITH ee um) is used to prevent and treat the manic episodes caused by manic-depressive illness. This medicine may be used for other purposes; ask your health care provider or pharmacist if you have questions. COMMON BRAND NAME(S): Eskalith What should I tell my health care provider before I take this medicine? They need to know if you have any of these conditions: -active infection -breathing problems -Brugada Syndrome -dehydration (diarrhea or sweating) -diet low in salt -heart disease -high levels of calcium in the blood -history of irregular heartbeat -kidney disease -low level of potassium or sodium in the blood -parathyroid disease -problems urinating -thyroid disease -an unusual or allergic reaction to lithium, other medicines, foods, dyes, or preservatives -pregnant or trying to get pregnant -breast-feeding How should I use this medicine? Take this medicine by mouth with a glass of water. Follow the directions on the prescription label. Take after a meal or snack to avoid stomach upset. Take your doses at regular intervals. Do not take your medicine more often than directed. The amount of this medicine you take is very important. Taking more than the prescribed dose can cause serious side effects. Do not stop taking except on the advice of your doctor or health care professional. Talk to your pediatrician regarding the use of this medicine in children. Special care may be needed. While this drug may be prescribed for children as young as 7 years for selected conditions, precautions do apply. Overdosage: If you think you have taken too much of this medicine contact a poison control center or emergency room at once. NOTE: This medicine is only for you. Do not share this medicine with others. What if I miss a dose? If you miss a dose, take it as soon as you can. If it is almost time for your next dose, take only that dose.  Do not take double or extra doses. What may interact with this medicine? Do not take this medicine with any of the following medications: -cisapride -dofetilide -dronedarone -pimozide -thioridazine This medicine may also interact with the following medications: -buspirone -caffeine -calcium iodide -carbamazepine -certain medicines for depression, anxiety, or psychotic disturbances -certain medicines for high blood pressure -certain medicines for migraine headache like almotriptan, eletriptan, frovatriptan, naratriptan, rizatriptan, sumatriptan, zolmitriptan -diuretics -fentanyl -linezolid -MAOIs like Carbex, Eldepryl, Marplan, Nardil, and Parnate -medicines that relax muscles for surgery -metronidazole -NSAIDs, medicines for pain and inflammation, like ibuprofen or naproxen -other medicines that prolong the QT interval (cause an abnormal heart rhythm) -phenytoin -potassium iodide, KI -sodium bicarbonate -sodium chloride -St. John's Wort -theophylline -tramadol -tryptophan -urea This list may not describe all possible interactions. Give your health care provider a list of all the medicines, herbs, non-prescription drugs, or dietary supplements you use. Also tell them if you smoke, drink alcohol, or use illegal drugs. Some items may interact with your medicine. What should I watch for while using this medicine? Visit your doctor or health care professional for regular checks on your progress. It can take several weeks of treatment before you start to get better. The amount of salt (sodium) in your body influences the effects of this medicine, and this medicine can increase salt loss from the body. Eat a normal diet that includes salt. Do not change to salt substitutes. Avoid changes involving diet, or medications that include large amounts of sodium like sodium bicarbonate. Ask your doctor or health care professional for advice if you are not sure. Drink plenty of fluids  in your body influences the effects of this medicine, and this medicine can increase salt loss from the body. Eat a normal diet that includes salt. Do not change to salt substitutes. Avoid changes involving diet, or medications that include large amounts of sodium like sodium bicarbonate. Ask your doctor or health care professional for advice if you are not sure.  Drink plenty of fluids while you  are taking this medicine. Avoid drinks that contain caffeine, such as coffee, tea and colas. You will need extra fluids if you have diarrhea or sweat a lot. This will help prevent toxic effects from this medicine. Be careful not to get overheated during exercise, saunas, hot baths, and hot weather. Consult your doctor or health care professional if you have a high fever or persistent diarrhea.  You may get drowsy or dizzy. Do not drive, use machinery, or do anything that needs mental alertness until you know how this medicine affects you. Do not stand or sit up quickly, especially if you are an older patient. This reduces the risk of dizzy or fainting spells.  What side effects may I notice from receiving this medicine?  Side effects that you should report to your doctor or health care professional as soon as possible:  -allergic reactions like skin rash, itching or hives, swelling of the face, lips, or tongue  -breathing problems  -changes in emotions or moods  -changes in vision  -fingers or toes feel numb, cool, painful  -hallucinations, loss of contact with reality  -increased thirst  -increased urination  -mild tremor (hands)  -persistent headache with or without blurred vision  -signs and symptoms of a dangerous change in heartbeat or heart rhythm like chest pain; dizziness; fast, irregular heartbeat; palpitations; feeling faint or lightheaded; falls; breathing problems  -signs and symptoms of lithium toxicity like diarrhea, vomiting, increased tremor, loss of balance or coordination, drowsiness, ringing of the ears, muscle weakness or twitching, slurred speech, confusion, seizures  -unusually weak or tired  Side effects that usually do not require medical attention (report to your doctor or health care professional if they continue or are bothersome):  -decreased appetite  -mild thirst  -nausea  -stomach upset  -tiredness  This list may not describe all possible side effects. Call your doctor for medical  advice about side effects. You may report side effects to FDA at 1-800-FDA-1088.  Where should I keep my medicine?  Keep out of the reach of children.  Store at room temperature between 15 and 30 degrees C (59 and 86 degrees F). Throw away any unused medicine after the expiration date.  NOTE: This sheet is a summary. It may not cover all possible information. If you have questions about this medicine, talk to your doctor, pharmacist, or health care provider.   2019 [employer]vier/Gold Standard (2017-01-17 09:11:16)

## 2018-07-29 ENCOUNTER — Encounter: Payer: Self-pay | Admitting: Student in an Organized Health Care Education/Training Program

## 2018-07-29 ENCOUNTER — Ambulatory Visit
Payer: Medicare HMO | Attending: Student in an Organized Health Care Education/Training Program | Admitting: Student in an Organized Health Care Education/Training Program

## 2018-07-29 ENCOUNTER — Other Ambulatory Visit: Payer: Self-pay | Admitting: Student in an Organized Health Care Education/Training Program

## 2018-07-29 ENCOUNTER — Other Ambulatory Visit: Payer: Self-pay

## 2018-07-29 DIAGNOSIS — I2510 Atherosclerotic heart disease of native coronary artery without angina pectoris: Secondary | ICD-10-CM

## 2018-07-29 DIAGNOSIS — M961 Postlaminectomy syndrome, not elsewhere classified: Secondary | ICD-10-CM

## 2018-07-29 DIAGNOSIS — Z9889 Other specified postprocedural states: Secondary | ICD-10-CM | POA: Diagnosis not present

## 2018-07-29 DIAGNOSIS — G894 Chronic pain syndrome: Secondary | ICD-10-CM | POA: Diagnosis not present

## 2018-07-29 DIAGNOSIS — G8929 Other chronic pain: Secondary | ICD-10-CM

## 2018-07-29 DIAGNOSIS — M5441 Lumbago with sciatica, right side: Secondary | ICD-10-CM

## 2018-07-29 DIAGNOSIS — F319 Bipolar disorder, unspecified: Secondary | ICD-10-CM

## 2018-07-29 DIAGNOSIS — M5442 Lumbago with sciatica, left side: Secondary | ICD-10-CM | POA: Diagnosis not present

## 2018-07-29 DIAGNOSIS — M7502 Adhesive capsulitis of left shoulder: Secondary | ICD-10-CM

## 2018-07-29 MED ORDER — BUPRENORPHINE 10 MCG/HR TD PTWK: 1.0000 | MEDICATED_PATCH | TRANSDERMAL | 1 refills | Status: DC

## 2018-07-29 NOTE — Progress Notes (Signed)
Pain Management Virtual Encounter Note - Virtual Visit via Video Conference Telehealth (real-time audio visits between healthcare provider and patient).  Patient's Phone No. & Preferred Pharmacy:  [phone] (home); [phone] (mobile); (Preferred) [phone] [email or web address]  [id number] [facility] [place], [place] - [address] [facility] [address] [place] [id number] Phone: [phone] Fax: [phone]   Pre-screening note:  Our staff contacted Mr. James Bryan and offered him an "in person", "face-to-face" appointment versus a telephone encounter. He indicated preferring the telephone encounter, at this time.  Reason for Virtual Visit: COVID-19*  Social distancing based on CDC and AMA recommendations.   I contacted James CarnesJason M Lusty on 07/29/2018 at 11:47 AM via video conference and clearly identified myself as James JollyBilal Verlie Hellenbrand, MD. I verified that I was speaking with the correct person using two identifiers (Name and date of birth: 05/24/1960).  Advanced Informed Consent I sought verbal advanced consent from James Bryan for virtual visit interactions. I informed Mr. James Bryan of possible security and privacy concerns, risks, and limitations associated with providing "not-in-person" medical evaluation and management services. I also informed Mr. James Bryan of the availability of "in-person" appointments. Finally, I informed him that there would be a charge for the virtual visit and that he could be  personally, fully or partially, financially responsible for it. Mr. James Bryan expressed understanding and agreed to proceed.   Historic Elements   Mr. James Bryan is a 58 y.o. year old, male patient evaluated today after his last encounter by our practice on 06/11/2018. Mr. James Bryan  has a past medical history of Anxiety, Coronary artery disease, Depression, Hyperlipidemia, Hypertension, Myocardial infarction [facility](HCC), and PTSD (post-traumatic stress disorder). He also  has a past  surgical history that includes Joint replacement (Right); Appendectomy; Lumbar laminectomy (2012); and thumb surgery. Mr. James Bryan has a current medication list which includes the following prescription(s): aspirin ec, atorvastatin, clopidogrel, gabapentin, ibuprofen, lithium carbonate, metoprolol succinate, mirtazapine, montelukast, naproxen, quetiapine, sildenafil, vitamin d (ergocalciferol), and buprenorphine. He  reports that he has been smoking cigarettes. He has been smoking about 0.25 packs per day. He has never used smokeless tobacco. He reports that he does not drink alcohol or use drugs. Mr. James Bryan is allergic to penicillins; sulfa antibiotics; corticosteroids; meperidine; and prednisone.   HPI  I last communicated with him on 06/11/2018. Today, he is being contacted for medication management.  Second patient visit, virtual.  Patient has seen psychiatrist, Dr. Elna BreslowEAPPEN and patient was deemed low risk for substance abuse/misuse.  He states that he developed a good rapport with a psychiatrist and is continuing to see her for his psychiatric management for his bipolar disorder, PTSD and panic attacks.  He was recently started on lithium for his bipolar disorder which he states is helping.  UDS appropriate.  Did counsel patient on risk of stoma with buprenorphine therapy.  While buprenorphine is a very mild acting opioid, I did discuss the increased risk of respiratory depression that can be seen with concomitant opioid and soma intake.  Patient endorsed understanding and states that he will try and find an alternative.    Pharmacotherapy Assessment   [place] PMP: PDMP reviewed during this encounter.          Plan: Refer to "POC".  Review of recent tests  CT RENAL STONE STUDY CLINICAL DATA:  58 year old male with 2 day history of back pain radiating to left flank with hematuria. Partial nephrectomy for stone years ago. Prior appendectomy. Initial encounter.  EXAM: CT ABDOMEN AND  PELVIS WITHOUT  CONTRAST  TECHNIQUE: Multidetector CT imaging of the abdomen and pelvis was performed following the standard protocol without IV contrast.  COMPARISON:  08/25/2009 CT.  FINDINGS: Lower chest: Progressive lung parenchymal changes with minimal pleural thickening. Heart size within normal limits with prominent right coronary artery calcifications.  Hepatobiliary: Taking into account limitation by non contrast imaging, no worrisome hepatic lesion. No calcified gallstone or common bile duct stone. No CT evidence of gallbladder inflammation.  Pancreas: Taking into account limitation by non contrast imaging, no worrisome pancreatic mass or inflammation.  Spleen: Taking into account limitation by non contrast imaging, no splenic mass or enlargement.  Adrenals/Urinary Tract: No obstructing stone or hydronephrosis. Left lower pole 2 mm nonobstructing stone. Tiny nonobstructing right renal calculi. Low-density renal lesions possibly cysts but too small to characterize. Haziness of perirenal fat planes greater on the right slightly changed from prior exam and may reflect result of prior episodes of obstruction/inflammation.  Taking into account limitation by non contrast imaging, no worrisome renal or adrenal lesion.  Urinary bladder incompletely distended with mild wall thickening greatest anteriorly. No polypoid mass identified.  Stomach/Bowel: No extraluminal bowel inflammatory process noted. Portions of the stomach, small bowel and colon are under distended and portions of the colon stool filled limiting evaluation.  Vascular/Lymphatic: Atherosclerotic changes aorta and aortic branch vessels. No abdominal aortic aneurysm.  Scattered normal size lymph nodes.  Reproductive: Top-normal size prostate gland.  Other: No free air or bowel containing hernia.  Musculoskeletal: Spinous process fusion device L4-5 level. Facet degenerative changes L3-4 and L4-5 with mild disc space  narrowing. Mild curvature lumbar spine convex left.  IMPRESSION: 1. No obstructing stone or hydronephrosis. Left lower pole 2 mm nonobstructing stone. Tiny nonobstructing right renal calculi. 2. Low-density renal lesions possibly cysts but too small to characterize. 3. Urinary bladder incompletely distended with mild wall thickening greatest anteriorly. No polypoid mass identified. If there is persistent hematuria, further investigation may be indicated. 4. Prominent right coronary artery calcification. 5.  Aortic Atherosclerosis (ICD10-I70.0). 6. Progressive lung parenchymal changes including minimal pleural thickening. Elective chest CT with high-resolution imaging may be considered.  Electronically Signed   By: Lacy Duverney M.D.   On: 03/18/2018 17:10   Office Visit on 06/11/2018  Component Date Value Ref Range Status  . Summary 06/11/2018 FINAL   Final   Comment: ==================================================================== TOXASSURE COMP DRUG ANALYSIS,UR ==================================================================== Test                             Result       Flag       Units Drug Present   Hydrocodone                    41                      ng/mg creat   Hydromorphone                  103                     ng/mg creat   Dihydrocodeine                 37                      ng/mg creat   Norhydrocodone  153                     ng/mg creat    Sources of hydrocodone include scheduled prescription    medications. Hydromorphone, dihydrocodeine and norhydrocodone are    expected metabolites of hydrocodone. Hydromorphone and    dihydrocodeine are also available as scheduled prescription    medications.   Meprobamate                    PRESENT    Source of meprobamate is most commonly as a metabolite of    carisoprodol, but it is also available as a prescription    medication. Source of carisoprodo                          l is a scheduled  prescription    medication.   Cyclobenzaprine                PRESENT   Desmethylcyclobenzaprine       PRESENT    Desmethylcyclobenzaprine is an expected metabolite of    cyclobenzaprine.   Mirtazapine                    PRESENT   Quetiapine                     PRESENT   Acetaminophen                  PRESENT   Chlorpheniramine               PRESENT ==================================================================== Test                      Result    Flag   Units      Ref Range   Creatinine              177              mg/dL      >=88 ==================================================================== Declared Medications:  Medication list was not provided. ==================================================================== For clinical consultation, please call [phone]. ====================================================================    Assessment  The primary encounter diagnosis was Chronic pain syndrome. Diagnoses of History of lumbar laminectomy, Post laminectomy syndrome, Chronic bilateral low back pain with bilateral sciatica, Bipolar 1 disorder (HCC), Adhesive capsulitis of left shoulder, and Coronary artery disease involving native coronary artery of native heart without angina pectoris (on Plavix) were also pertinent to this visit.  Plan of Care  I have discontinued Jerritt Oberg. [place]erico's HYDROcodone-acetaminophen, lidocaine, traMADol, oseltamivir, and chlorpheniramine-HYDROcodone. I am also having him start on buprenorphine. Additionally, I am having him maintain his montelukast, metoprolol succinate, clopidogrel, atorvastatin, aspirin EC, Vitamin D (Ergocalciferol), ibuprofen, naproxen, gabapentin, mirtazapine, sildenafil, QUEtiapine, and lithium carbonate.  Patient will come to [place] tomorrow to sign opioid contract.  Nursing staff has been notified.  UDS appropriate, deemed low risk for opioid misuse/abuse after evaluation by psychiatry in-house.  Prescription  for Butrans patch at 10 mcg below with 1 refill.  Patient instructed to try and find alternative to [facility] given risk of sedation and respiratory depression.   Pharmacotherapy (Medications Ordered): Meds ordered this encounter  Medications  . buprenorphine (BUTRANS) 10 MCG/HR PTWK patch    Sig: Place 1 patch onto the skin every 7 (seven) days for 30 days.    Dispense:  4 patch    Refill:  1  Do not place this medication, or any other prescription from our practice, on "Automatic Refill". Patient may have prescription filled one day early if pharmacy is closed on scheduled refill date.   Orders:  No orders of the defined types were placed in this encounter.  Follow-up plan:   Return in about 8 weeks (around 09/23/2018) for Medication Management.   I discussed the assessment and treatment plan with the patient. The patient was provided an opportunity to ask questions and all were answered. The patient agreed with the plan and demonstrated an understanding of the instructions.  Patient advised to call back or seek an in-person evaluation if the symptoms or condition worsens.  Total duration of non-face-to-face encounter: 25 minutes.  Note by: James Jolly, MD Date: 07/29/2018; Time: 11:47 AM  Disclaimer:  * Given the special circumstances of the COVID-19 pandemic, the federal government has announced that the Office for Civil Rights (OCR) will exercise its enforcement discretion and will not impose penalties on physicians using telehealth in the event of noncompliance with regulatory requirements under the [employer] Portability and Accountability Act (HIPAA) in connection with the good faith provision of telehealth during the COVID-19 national public health emergency. (AMA)

## 2018-07-30 NOTE — Progress Notes (Signed)
Just wanted to let you know that Opoid contract was signed and a copy was given to the patient.

## 2018-08-01 ENCOUNTER — Other Ambulatory Visit: Payer: Self-pay

## 2018-08-01 ENCOUNTER — Ambulatory Visit: Payer: Medicare HMO | Admitting: Licensed Clinical Social Worker

## 2018-08-05 ENCOUNTER — Telehealth: Payer: Self-pay | Admitting: Student in an Organized Health Care Education/Training Program

## 2018-08-05 ENCOUNTER — Telehealth: Payer: Self-pay | Admitting: *Deleted

## 2018-08-05 NOTE — Telephone Encounter (Signed)
Pt called stating that the butrans patch is not giving him any relief and wants to know if there is something else that he can take.

## 2018-08-05 NOTE — Telephone Encounter (Signed)
Sorry Dr Cherylann Ratel didn't mean to send this to you.

## 2018-08-05 NOTE — Telephone Encounter (Signed)
Voicemail left with patient that he will need to schedule an appt with DR Cherylann Ratel about any medication issues. Forwarded to secretaries

## 2018-08-06 ENCOUNTER — Encounter: Payer: Self-pay | Admitting: Student in an Organized Health Care Education/Training Program

## 2018-08-07 ENCOUNTER — Other Ambulatory Visit: Payer: Self-pay

## 2018-08-07 ENCOUNTER — Ambulatory Visit
Payer: Medicare HMO | Attending: Student in an Organized Health Care Education/Training Program | Admitting: Student in an Organized Health Care Education/Training Program

## 2018-08-07 DIAGNOSIS — M961 Postlaminectomy syndrome, not elsewhere classified: Secondary | ICD-10-CM | POA: Diagnosis not present

## 2018-08-07 DIAGNOSIS — F319 Bipolar disorder, unspecified: Secondary | ICD-10-CM

## 2018-08-07 DIAGNOSIS — G894 Chronic pain syndrome: Secondary | ICD-10-CM

## 2018-08-07 DIAGNOSIS — G8929 Other chronic pain: Secondary | ICD-10-CM

## 2018-08-07 DIAGNOSIS — Z9889 Other specified postprocedural states: Secondary | ICD-10-CM

## 2018-08-07 DIAGNOSIS — M5442 Lumbago with sciatica, left side: Secondary | ICD-10-CM | POA: Diagnosis not present

## 2018-08-07 DIAGNOSIS — M7502 Adhesive capsulitis of left shoulder: Secondary | ICD-10-CM

## 2018-08-07 DIAGNOSIS — M5441 Lumbago with sciatica, right side: Secondary | ICD-10-CM

## 2018-08-07 DIAGNOSIS — I2510 Atherosclerotic heart disease of native coronary artery without angina pectoris: Secondary | ICD-10-CM

## 2018-08-07 NOTE — Progress Notes (Signed)
Pain Management Virtual Encounter Note - Virtual Visit via Video Conference Telehealth (real-time audio visits between healthcare provider and patient).  Patient's Phone No. & Preferred Pharmacy:  [phone] (home); [phone] (mobile); (Preferred) [phone] [email or web address]  [id number] [facility], [place] - [address] AT [facility] [address] [place] [address] Phone: [phone] Fax: [phone]   Pre-screening note:  Our staff contacted Mr. Rebich and offered him an "in person", "face-to-face" appointment versus a telephone encounter. He indicated preferring the telephone encounter, at this time.  Reason for Virtual Visit: COVID-19*  Social distancing based on CDC and AMA recommendations.   I contacted James Bryan on 08/07/2018 at 10:40 AM via video conference.      I clearly identified myself as Edward Jolly, MD. I verified that I was speaking with the correct person using two identifiers (Name and date of birth: 06-08-1960).  Advanced Informed Consent I sought verbal advanced consent from James Bryan for virtual visit interactions. I informed Mr. Bedner of possible security and privacy concerns, risks, and limitations associated with providing "not-in-person" medical evaluation and management services. I also informed Mr. Nong of the availability of "in-person" appointments. Finally, I informed him that there would be a charge for the virtual visit and that he could be  personally, fully or partially, financially responsible for it. Mr. Santora expressed understanding and agreed to proceed.   Historic Elements   Mr. JOBE CRISTE is a 58 y.o. year old, male patient evaluated today after his last encounter by our practice on 08/05/2018. Mr. Quenneville  has a past medical history of Anxiety, Coronary artery disease, Depression, Hyperlipidemia, Hypertension, Myocardial infarction [facility]), and PTSD (post-traumatic stress disorder). He also  has a  past surgical history that includes Joint replacement (Right); Appendectomy; Lumbar laminectomy (2012); and thumb surgery. Mr. Leeds has a current medication list which includes the following prescription(s): aspirin ec, atorvastatin, buprenorphine, clopidogrel, gabapentin, ibuprofen, lithium carbonate, melatonin, metoprolol succinate, mirtazapine, montelukast, naproxen, quetiapine, sildenafil, and vitamin d (ergocalciferol). He  reports that he has been smoking cigarettes. He has been smoking about 0.25 packs per day. He has never used smokeless tobacco. He reports that he does not drink alcohol or use drugs. Mr. Graulich is allergic to penicillins; sulfa antibiotics; corticosteroids; meperidine; and prednisone.   HPI  I last communicated with him on 08/05/2018. Today, he is being contacted for medication management.   Patient was started on buprenorphine patch.  He states that he has been on this medication for 1 week but is not experiencing pain relief.  I encouraged the patient to give this medication at least another 2 weeks for it to achieve steady state pharmacodynamics in his system and then evaluate for analgesic benefit.  If after 2 weeks, patient still states that it is not providing him with pain relief, options at that time include transitioning to a belbuca or increasing Butrans patch to 15 mcg an hour and assessing analgesic benefit.  Patient endorsed understanding and is in agreement with plan.  Pharmacotherapy Assessment   07/29/2018  3   07/29/2018  Buprenorphine 10 Mcg/Hr Patch  4.00 30 Bi Lat   [id number]   Nor (5579)   0  0.22 mg  Private Pay   [place]    Monitoring: Pharmacotherapy: No side-effects or adverse reactions reported. [place] PMP: PDMP reviewed during this encounter.          Compliance: No problems identified or detected. Plan: Refer to "POC".  Review of recent  tests  CT RENAL STONE STUDY CLINICAL DATA:  58 year old male with 2 day history of back pain radiating to left flank  with hematuria. Partial nephrectomy for stone years ago. Prior appendectomy. Initial encounter.  EXAM: CT ABDOMEN AND PELVIS WITHOUT CONTRAST  TECHNIQUE: Multidetector CT imaging of the abdomen and pelvis was performed following the standard protocol without IV contrast.  COMPARISON:  08/25/2009 CT.  FINDINGS: Lower chest: Progressive lung parenchymal changes with minimal pleural thickening. Heart size within normal limits with prominent right coronary artery calcifications.  Hepatobiliary: Taking into account limitation by non contrast imaging, no worrisome hepatic lesion. No calcified gallstone or common bile duct stone. No CT evidence of gallbladder inflammation.  Pancreas: Taking into account limitation by non contrast imaging, no worrisome pancreatic mass or inflammation.  Spleen: Taking into account limitation by non contrast imaging, no splenic mass or enlargement.  Adrenals/Urinary Tract: No obstructing stone or hydronephrosis. Left lower pole 2 mm nonobstructing stone. Tiny nonobstructing right renal calculi. Low-density renal lesions possibly cysts but too small to characterize. Haziness of perirenal fat planes greater on the right slightly changed from prior exam and may reflect result of prior episodes of obstruction/inflammation.  Taking into account limitation by non contrast imaging, no worrisome renal or adrenal lesion.  Urinary bladder incompletely distended with mild wall thickening greatest anteriorly. No polypoid mass identified.  Stomach/Bowel: No extraluminal bowel inflammatory process noted. Portions of the stomach, small bowel and colon are under distended and portions of the colon stool filled limiting evaluation.  Vascular/Lymphatic: Atherosclerotic changes aorta and aortic branch vessels. No abdominal aortic aneurysm.  Scattered normal size lymph nodes.  Reproductive: Top-normal size prostate gland.  Other: No free air or bowel containing  hernia.  Musculoskeletal: Spinous process fusion device L4-5 level. Facet degenerative changes L3-4 and L4-5 with mild disc space narrowing. Mild curvature lumbar spine convex left.  IMPRESSION: 1. No obstructing stone or hydronephrosis. Left lower pole 2 mm nonobstructing stone. Tiny nonobstructing right renal calculi. 2. Low-density renal lesions possibly cysts but too small to characterize. 3. Urinary bladder incompletely distended with mild wall thickening greatest anteriorly. No polypoid mass identified. If there is persistent hematuria, further investigation may be indicated. 4. Prominent right coronary artery calcification. 5.  Aortic Atherosclerosis (ICD10-I70.0). 6. Progressive lung parenchymal changes including minimal pleural thickening. Elective chest CT with high-resolution imaging may be considered.  Electronically Signed   By: Lacy DuverneySteven  Olson M.D.   On: 03/18/2018 17:10   Office Visit on 06/11/2018  Component Date Value Ref Range Status  . Summary 06/11/2018 FINAL   Final   Comment: ==================================================================== TOXASSURE COMP DRUG ANALYSIS,UR ==================================================================== Test                             Result       Flag       Units Drug Present   Hydrocodone                    41                      ng/mg creat   Hydromorphone                  103                     ng/mg creat   Dihydrocodeine  37                      ng/mg creat   Norhydrocodone                 153                     ng/mg creat    Sources of hydrocodone include scheduled prescription    medications. Hydromorphone, dihydrocodeine and norhydrocodone are    expected metabolites of hydrocodone. Hydromorphone and    dihydrocodeine are also available as scheduled prescription    medications.   Meprobamate                    PRESENT    Source of meprobamate is most commonly as a metabolite of     carisoprodol, but it is also available as a prescription    medication. Source of carisoprodo                          l is a scheduled prescription    medication.   Cyclobenzaprine                PRESENT   Desmethylcyclobenzaprine       PRESENT    Desmethylcyclobenzaprine is an expected metabolite of    cyclobenzaprine.   Mirtazapine                    PRESENT   Quetiapine                     PRESENT   Acetaminophen                  PRESENT   Chlorpheniramine               PRESENT ==================================================================== Test                      Result    Flag   Units      Ref Range   Creatinine              177              mg/dL      >=16 ==================================================================== Declared Medications:  Medication list was not provided. ==================================================================== For clinical consultation, please call [phone]. ====================================================================    Assessment  The primary encounter diagnosis was Chronic pain syndrome. Diagnoses of History of lumbar laminectomy, Post laminectomy syndrome, Chronic bilateral low back pain with bilateral sciatica, Bipolar 1 disorder (HCC), Adhesive capsulitis of left shoulder, and Coronary artery disease involving native coronary artery of native heart without angina pectoris (on Plavix) were also pertinent to this visit.  Plan of Care  I am having Elissa Hefty. Ladona Ridgel maintain his montelukast, metoprolol succinate, clopidogrel, atorvastatin, aspirin EC, Vitamin D (Ergocalciferol), ibuprofen, naproxen, gabapentin, mirtazapine, sildenafil, QUEtiapine, lithium carbonate, buprenorphine, and Melatonin.   Patient was started on buprenorphine patch.  He states that he has been on this medication for 1 week but is not experiencing pain relief.  I encouraged the patient to give this medication at least another 2 weeks for it to  achieve steady state pharmacodynamics in his system and then evaluate for analgesic benefit.  If after 2 weeks, patient still states that it is not providing him with pain relief, options at that time include transitioning to a belbuca or increasing Butrans  patch to 15 mcg an hour and assessing analgesic benefit.  Patient endorsed understanding and is in agreement with plan.  I discussed the assessment and treatment plan with the patient. The patient was provided an opportunity to ask questions and all were answered. The patient agreed with the plan and demonstrated an understanding of the instructions.  Patient advised to call back or seek an in-person evaluation if the symptoms or condition worsens.  Total duration of non-face-to-face encounter: 15 minutes.  Note by: Edward Jolly, MD Date: 08/07/2018; Time: 10:40 AM  Disclaimer:  * Given the special circumstances of the COVID-19 pandemic, the federal government has announced that the Office for Civil Rights (OCR) will exercise its enforcement discretion and will not impose penalties on physicians using telehealth in the event of noncompliance with regulatory requirements under the [employer] Portability and Accountability Act (HIPAA) in connection with the good faith provision of telehealth during the COVID-19 national public health emergency. (AMA)

## 2018-08-10 ENCOUNTER — Other Ambulatory Visit: Payer: Self-pay | Admitting: Psychiatry

## 2018-08-10 DIAGNOSIS — F3161 Bipolar disorder, current episode mixed, mild: Secondary | ICD-10-CM

## 2018-08-16 ENCOUNTER — Other Ambulatory Visit: Payer: Self-pay | Admitting: Psychiatry

## 2018-08-16 DIAGNOSIS — F3161 Bipolar disorder, current episode mixed, mild: Secondary | ICD-10-CM

## 2018-08-16 NOTE — Progress Notes (Signed)
[place] [facility] Initial Clinical Assessment  MRN: [id number] NAME: James Bryan Date: 07/25/18  Total time: 1 hour  Type of Contact:  telephone Patient consent obtained:  yes Reason for Visit today:  establish care  Treatment History Patient recently received Inpatient Treatment:  denies  Facility/Program:    Date of discharge:   Patient currently being seen by therapist/psychiatrist:  Dr. Elna Breslow Patient currently receiving the following services:    Past Psychiatric History/Hospitalization(s): Anxiety: No Bipolar Disorder: No Depression: No Mania: No Psychosis: No Schizophrenia: No Personality Disorder: No Hospitalization for psychiatric illness: No History of Electroconvulsive Shock Bryan: No Prior Suicide Attempts: No  Clinical Assessment:  PHQ-9 Assessments: Depression screen [facility] 2/9 06/11/2018  Decreased Interest 0  Down, Depressed, Hopeless 0  PHQ - 2 Score 0    GAD-7 Assessments: No flowsheet data found.   Social Functioning Social maturity:   Social judgement:    Stress Current stressors:   Familial stressors:   Sleep:   Appetite:   Coping ability:   Patient taking medications as prescribed:    Current medications:  Outpatient Encounter Medications as of 07/25/2018  Medication Sig  . aspirin EC 81 MG tablet Take 81 mg by mouth daily.  Marland Kitchen atorvastatin (LIPITOR) 40 MG tablet Take 40 mg by mouth daily.  . clopidogrel (PLAVIX) 75 MG tablet Take 75 mg by mouth daily.  Marland Kitchen gabapentin (NEURONTIN) 600 MG tablet TK 1 T PO BID AND 2 TS AT BEDTIME  . ibuprofen (ADVIL,MOTRIN) 600 MG tablet Take 1 tablet (600 mg total) by mouth every 8 (eight) hours as needed.  . lithium carbonate 150 MG capsule Take 1 capsule (150 mg total) by mouth daily with supper.  . metoprolol succinate (TOPROL-XL) 100 MG 24 hr tablet Take 100 mg by mouth daily. Take with or immediately following a meal.  . mirtazapine (REMERON) 30 MG tablet Take 30 mg by mouth at bedtime.   .  montelukast (SINGULAIR) 10 MG tablet Take 10 mg by mouth at bedtime.  . naproxen (NAPROSYN) 500 MG tablet Take 1 tablet (500 mg total) by mouth 2 (two) times daily with a meal.  . QUEtiapine (SEROQUEL) 300 MG tablet quetiapine 300 mg tablet  . sildenafil (REVATIO) 20 MG tablet Take three to five up to once daily as needed for erectile dysfunction.  . Vitamin D, Ergocalciferol, (DRISDOL) 50000 units CAPS capsule Take 50,000 Units by mouth every 7 (seven) days.  . [DISCONTINUED] chlorpheniramine-HYDROcodone (TUSSIONEX PENNKINETIC ER) 10-8 MG/5ML SUER Take 5 mLs by mouth every 12 (twelve) hours as needed. (Patient not taking: Reported on 06/11/2018)  . [DISCONTINUED] HYDROcodone-acetaminophen (NORCO/VICODIN) 5-325 MG tablet Take 1 tablet by mouth every 6 (six) hours as needed. (Patient not taking: Reported on 06/11/2018)  . [DISCONTINUED] lidocaine (LIDODERM) 5 % Place 1 patch onto the skin every 12 (twelve) hours. Remove & Discard patch within 12 hours or as directed by MD (Patient not taking: Reported on 06/11/2018)  . [DISCONTINUED] oseltamivir (TAMIFLU) 75 MG capsule Take 1 capsule (75 mg total) by mouth 2 (two) times daily. (Patient not taking: Reported on 06/11/2018)  . [DISCONTINUED] traMADol (ULTRAM) 50 MG tablet Take 1 tablet (50 mg total) by mouth every 6 (six) hours as needed. (Patient not taking: Reported on 06/11/2018)   No facility-administered encounter medications on file as of 07/25/2018.     Self-harm Behaviors Risk Assessment Self-harm risk factors:   Patient endorses recent thoughts of harming self:    [place] Suicide Severity Rating Scale: No flowsheet  data found.  Danger to Others Risk Assessment Danger to others risk factors:   Patient endorses recent thoughts of harming others:    Dynamic Appraisal of Situational Aggression (DASA): No flowsheet data found.  Substance Use Assessment Patient recently consumed alcohol:    Alcohol Use Disorder Identification Test (AUDIT): No  flowsheet data found. Patient recently used drugs:    Opioid Risk Assessment:  Patient is concerned about dependence or abuse of substances:    ASAM Multidimensional Assessment Summary:  Dimension 1:    Dimension 1 Rating:    Dimension 2:    Dimension 2 Rating:    Dimension 3:    Dimension 3 Rating:    Dimension 4:    Dimension 4 Rating:    Dimension 5:    Dimension 5 Rating:    Dimension 6:    Dimension 6 Rating:   ASAM's Severity Rating Score:   ASAM Recommended Level of Treatment:     Goals, Interventions and Follow-up Plan Goals: Increase healthy adjustment to current life circumstances Interventions: Motivational Interviewing and Solution-Focused Strategies Follow-up Plan: Refer to James Bryan  Summary of Clinical Assessment Summary: James Bryan is a 58 year old male who presents for service due to PTSD and anxiety.  He reports a history of PTSD.  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 HE states that he has seen so much working tine the ER at times he dreams about previous events. Patient does report some panic attacks when he has racing heart rate, inability to breathe, feeling of impending doom and so on which can happen out of the blue.   James ElkNicole M Jenin Birdsall, LCSW

## 2018-08-20 ENCOUNTER — Telehealth: Payer: Self-pay | Admitting: Student in an Organized Health Care Education/Training Program

## 2018-08-20 NOTE — Telephone Encounter (Signed)
Pt called stating that Dr Cherylann Ratel told him to let him know before his next refill whether or not the new does of the pain patches were working. Pt states that it is not working and stated that Dr Cherylann Ratel told him he would give him a sublingual medication this time.

## 2018-08-21 ENCOUNTER — Telehealth: Payer: Self-pay | Admitting: *Deleted

## 2018-08-21 NOTE — Telephone Encounter (Signed)
Voicemail left for patient to call re; VV on 08/22/18 with DR Cherylann Ratel.

## 2018-08-22 ENCOUNTER — Other Ambulatory Visit: Payer: Self-pay

## 2018-08-22 ENCOUNTER — Encounter: Payer: Self-pay | Admitting: Student in an Organized Health Care Education/Training Program

## 2018-08-22 ENCOUNTER — Ambulatory Visit
Payer: Medicare HMO | Attending: Student in an Organized Health Care Education/Training Program | Admitting: Student in an Organized Health Care Education/Training Program

## 2018-08-22 DIAGNOSIS — G894 Chronic pain syndrome: Secondary | ICD-10-CM

## 2018-08-22 DIAGNOSIS — I2510 Atherosclerotic heart disease of native coronary artery without angina pectoris: Secondary | ICD-10-CM

## 2018-08-22 DIAGNOSIS — M961 Postlaminectomy syndrome, not elsewhere classified: Secondary | ICD-10-CM | POA: Diagnosis not present

## 2018-08-22 DIAGNOSIS — F319 Bipolar disorder, unspecified: Secondary | ICD-10-CM

## 2018-08-22 DIAGNOSIS — G8929 Other chronic pain: Secondary | ICD-10-CM

## 2018-08-22 DIAGNOSIS — M5441 Lumbago with sciatica, right side: Secondary | ICD-10-CM

## 2018-08-22 DIAGNOSIS — Z9889 Other specified postprocedural states: Secondary | ICD-10-CM

## 2018-08-22 DIAGNOSIS — M7502 Adhesive capsulitis of left shoulder: Secondary | ICD-10-CM

## 2018-08-22 DIAGNOSIS — M5442 Lumbago with sciatica, left side: Secondary | ICD-10-CM

## 2018-08-22 MED ORDER — BUPRENORPHINE 15 MCG/HR TD PTWK: 15.0000 ug/h | MEDICATED_PATCH | TRANSDERMAL | 0 refills | Status: DC

## 2018-08-22 NOTE — Progress Notes (Signed)
Pain Management Virtual Encounter Note - Virtual Visit via Video Conference Telehealth (real-time audio visits between healthcare provider and patient).  Patient's Phone No. & Preferred Pharmacy:  [phone] (home); [phone] (mobile); (Preferred) [phone] [email or web address]  [id number] [place] [place], [place] - [address] [facility] [address]  [place] [address] Phone: [phone] Fax: [phone]   Pre-screening note:  Our staff contacted James Bryan and offered him an "in person", "face-to-face" appointment versus a telephone encounter. He indicated preferring the telephone encounter, at this time.  Reason for Virtual Visit: COVID-19*  Social distancing based on CDC and AMA recommendations.   I contacted James CarnesJason M Rosado on 08/22/2018 at 11:08 AM via video conference.      I clearly identified myself as Edward JollyBilal Aasiya Creasey, MD. I verified that I was speaking with the correct person using two identifiers (Name and date of birth: 10/28/1960).  Advanced Informed Consent I sought verbal advanced consent from James Bryan for virtual visit interactions. I informed James Bryan of possible security and privacy concerns, risks, and limitations associated with providing "not-in-person" medical evaluation and management services. I also informed James Bryan of the availability of "in-person" appointments. Finally, I informed him that there would be a charge for the virtual visit and that he could be  personally, fully or partially, financially responsible for it. James Bryan expressed understanding and agreed to proceed.   Historic Elements   Mr. James Bryan is a 58 y.o. year old, male patient evaluated today after his last encounter by our practice on 08/21/2018. James Bryan  has a past medical history of Anxiety, Coronary artery disease, Depression, Hyperlipidemia, Hypertension, Myocardial infarction [facility](HCC), and PTSD (post-traumatic stress disorder). He also  has a  past surgical history that includes Joint replacement (Right); Appendectomy; Lumbar laminectomy (2012); and thumb surgery. James Bryan has a current medication list which includes the following prescription(s): aspirin ec, atorvastatin, clopidogrel, gabapentin, ibuprofen, lithium carbonate, melatonin, metoprolol succinate, mirtazapine, montelukast, naproxen, quetiapine, vitamin d (ergocalciferol), buprenorphine, and sildenafil. He  reports that he has been smoking cigarettes. He has been smoking about 0.25 packs per day. He has never used smokeless tobacco. He reports that he does not drink alcohol or use drugs. James Bryan is allergic to penicillins; sulfa antibiotics; corticosteroids; meperidine; and prednisone.   HPI  I last communicated with him on 08/20/2018. Today, he is being contacted for medication management.  Patient states that he is not obtaining any noticeable benefit on his current Butrans patch.  Patient is requesting oral buprenorphine.  He is referring to Suboxone.  I informed the patient that I do not have a DEA X license to prescribe Suboxone and that if he is interested in this for pain management as [place] was [place] previously [place], he will need to find a Suboxone provider in the community.  We also discussed belbuca however this would be more than $400 out-of-pocket for the patient.  To help improve analgesic benefit, we discussed increasing Butrans patch to 15 mcg an hour and assessing analgesic benefit.  Patient endorsed understanding is agreement with plan.  I will follow the patient in 1 month.  Pharmacotherapy Assessment   07/29/2018  3   07/29/2018  Buprenorphine 10 Mcg/Hr Patch  4.00 30 Bi Lat   [id number]   Nor (5579)   0  0.22 mg  Private Pay   [place]   .   Monitoring: Pharmacotherapy: No side-effects or adverse reactions reported. [place] PMP: PDMP reviewed during this  encounter.       Compliance: No problems identified. Effectiveness: Clinically acceptable. Plan: Refer  to "POC".  Increase patch to 15 mcg an hour.  Pertinent Labs  Renal Function Lab Results  Component Value Date   BUN 10 03/18/2018   CREATININE 0.72 03/18/2018   GFRAA >60 03/18/2018   GFRNONAA >60 03/18/2018   Hepatic Function Lab Results  Component Value Date   AST 21 07/13/2017   ALT 10 (L) 07/13/2017   ALBUMIN 4.0 07/13/2017   UDS Summary  Date Value Ref Range Status  06/11/2018 FINAL  Final    Comment:    ==================================================================== TOXASSURE COMP DRUG ANALYSIS,UR ==================================================================== Test                             Result       Flag       Units Drug Present   Hydrocodone                    41                      ng/mg creat   Hydromorphone                  103                     ng/mg creat   Dihydrocodeine                 37                      ng/mg creat   Norhydrocodone                 153                     ng/mg creat    Sources of hydrocodone include scheduled prescription    medications. Hydromorphone, dihydrocodeine and norhydrocodone are    expected metabolites of hydrocodone. Hydromorphone and    dihydrocodeine are also available as scheduled prescription    medications.   Meprobamate                    PRESENT    Source of meprobamate is most commonly as a metabolite of    carisoprodol, but it is also available as a prescription    medication. Source of carisoprodol is a scheduled prescription    medication.   Cyclobenzaprine                PRESENT   Desmethylcyclobenzaprine       PRESENT    Desmethylcyclobenzaprine is an expected metabolite of    cyclobenzaprine.   Mirtazapine                    PRESENT   Quetiapine                     PRESENT   Acetaminophen                  PRESENT   Chlorpheniramine               PRESENT ==================================================================== Test                      Result    Flag   Units  Ref  Range   Creatinine              177              mg/dL      >=16 ==================================================================== Declared Medications:  Medication list was not provided. ==================================================================== For clinical consultation, please call [phone]. ====================================================================    Note: Above Lab results reviewed.  Recent imaging  CT RENAL STONE STUDY CLINICAL DATA:  58 year old male with 2 day history of back pain radiating to left flank with hematuria. Partial nephrectomy for stone years ago. Prior appendectomy. Initial encounter.  EXAM: CT ABDOMEN AND PELVIS WITHOUT CONTRAST  TECHNIQUE: Multidetector CT imaging of the abdomen and pelvis was performed following the standard protocol without IV contrast.  COMPARISON:  08/25/2009 CT.  FINDINGS: Lower chest: Progressive lung parenchymal changes with minimal pleural thickening. Heart size within normal limits with prominent right coronary artery calcifications.  Hepatobiliary: Taking into account limitation by non contrast imaging, no worrisome hepatic lesion. No calcified gallstone or common bile duct stone. No CT evidence of gallbladder inflammation.  Pancreas: Taking into account limitation by non contrast imaging, no worrisome pancreatic mass or inflammation.  Spleen: Taking into account limitation by non contrast imaging, no splenic mass or enlargement.  Adrenals/Urinary Tract: No obstructing stone or hydronephrosis. Left lower pole 2 mm nonobstructing stone. Tiny nonobstructing right renal calculi. Low-density renal lesions possibly cysts but too small to characterize. Haziness of perirenal fat planes greater on the right slightly changed from prior exam and may reflect result of prior episodes of obstruction/inflammation.  Taking into account limitation by non contrast imaging, no worrisome renal or adrenal  lesion.  Urinary bladder incompletely distended with mild wall thickening greatest anteriorly. No polypoid mass identified.  Stomach/Bowel: No extraluminal bowel inflammatory process noted. Portions of the stomach, small bowel and colon are under distended and portions of the colon stool filled limiting evaluation.  Vascular/Lymphatic: Atherosclerotic changes aorta and aortic branch vessels. No abdominal aortic aneurysm.  Scattered normal size lymph nodes.  Reproductive: Top-normal size prostate gland.  Other: No free air or bowel containing hernia.  Musculoskeletal: Spinous process fusion device L4-5 level. Facet degenerative changes L3-4 and L4-5 with mild disc space narrowing. Mild curvature lumbar spine convex left.  IMPRESSION: 1. No obstructing stone or hydronephrosis. Left lower pole 2 mm nonobstructing stone. Tiny nonobstructing right renal calculi. 2. Low-density renal lesions possibly cysts but too small to characterize. 3. Urinary bladder incompletely distended with mild wall thickening greatest anteriorly. No polypoid mass identified. If there is persistent hematuria, further investigation may be indicated. 4. Prominent right coronary artery calcification. 5.  Aortic Atherosclerosis (ICD10-I70.0). 6. Progressive lung parenchymal changes including minimal pleural thickening. Elective chest CT with high-resolution imaging may be considered.  Electronically Signed   By: Lacy Duverney M.D.   On: 03/18/2018 17:10  Assessment  The primary encounter diagnosis was Chronic pain syndrome. Diagnoses of History of lumbar laminectomy, Post laminectomy syndrome, Chronic bilateral low back pain with bilateral sciatica, Bipolar 1 disorder (HCC), Adhesive capsulitis of left shoulder, and Coronary artery disease involving native coronary artery of native heart without angina pectoris (on Plavix) were also pertinent to this visit.  Patient states that he is not obtaining any  noticeable benefit on his current Butrans patch.  Patient is requesting oral buprenorphine.  He is referring to Suboxone.  I informed the patient that I do not have a DEA X license to prescribe Suboxone and that if he is interested in this  for pain management as he was on Suboxone previously in [place], he will need to find a Suboxone provider in the community.  We also discussed belbuca however this would be more than $400 out-of-pocket for the patient.  To help improve analgesic benefit, we discussed increasing Butrans patch to 15 mcg an hour and assessing analgesic benefit.  Patient endorsed understanding is agreement with plan.  I will follow the patient in 1 month.  Plan of Care  I have discontinued Noeh Duffek. Alarie's buprenorphine. I am also having him start on Buprenorphine. Additionally, I am having him maintain his montelukast, metoprolol succinate, clopidogrel, atorvastatin, aspirin EC, Vitamin D (Ergocalciferol), ibuprofen, naproxen, gabapentin, mirtazapine, sildenafil, QUEtiapine, Melatonin, and lithium carbonate.  Pharmacotherapy (Medications Ordered): Meds ordered this encounter  Medications  . Buprenorphine 15 MCG/HR PTWK    Sig: Place 15 mcg/hr onto the skin every 7 (seven) days for 30 days.    Dispense:  4 patch    Refill:  0    Do not place this medication, or any other prescription from our practice, on "Automatic Refill". Patient may have prescription filled one day early if pharmacy is closed on scheduled refill date.   Orders:  No orders of the defined types were placed in this encounter.  Follow-up plan:   Return in about 4 weeks (around 09/19/2018) for Medication Management.    I discussed the assessment and treatment plan with the patient. The patient was provided an opportunity to ask questions and all were answered. The patient agreed with the plan and demonstrated an understanding of the instructions.  Patient advised to call back or seek an in-person evaluation if the  symptoms or condition worsens.  Total duration of non-face-to-face encounter: .  Note by: Edward Jolly, MD Date: 08/22/2018; Time: 11:08 AM  Note: This dictation was prepared with Dragon dictation. Any transcriptional errors that may result from this process are unintentional.  Disclaimer:  * Given the special circumstances of the COVID-19 pandemic, the federal government has announced that the Office for Civil Rights (OCR) will exercise its enforcement discretion and will not impose penalties on physicians using telehealth in the event of noncompliance with regulatory requirements under the [employer] Portability and Accountability Act (HIPAA) in connection with the good faith provision of telehealth during the COVID-19 national public health emergency. (AMA)

## 2018-08-23 ENCOUNTER — Encounter: Payer: Self-pay | Admitting: Psychiatry

## 2018-08-23 ENCOUNTER — Other Ambulatory Visit: Payer: Self-pay

## 2018-08-23 ENCOUNTER — Ambulatory Visit (INDEPENDENT_AMBULATORY_CARE_PROVIDER_SITE_OTHER): Payer: Medicare HMO | Admitting: Psychiatry

## 2018-08-23 DIAGNOSIS — F4310 Post-traumatic stress disorder, unspecified: Secondary | ICD-10-CM | POA: Diagnosis not present

## 2018-08-23 DIAGNOSIS — F3161 Bipolar disorder, current episode mixed, mild: Secondary | ICD-10-CM | POA: Diagnosis not present

## 2018-08-23 DIAGNOSIS — F410 Panic disorder [episodic paroxysmal anxiety] without agoraphobia: Secondary | ICD-10-CM | POA: Diagnosis not present

## 2018-08-23 MED ORDER — MIRTAZAPINE 30 MG PO TABS: 30.0000 mg | ORAL_TABLET | Freq: Every day | ORAL | 0 refills | Status: DC

## 2018-08-23 MED ORDER — QUETIAPINE FUMARATE 300 MG PO TABS: 600.0000 mg | ORAL_TABLET | Freq: Every day | ORAL | 0 refills | Status: DC

## 2018-08-23 NOTE — Progress Notes (Signed)
Virtual Visit via Telephone Note  I connected with James Bryan on 08/23/18 at 10:00 AM EDT by telephone and verified that I am speaking with the correct person using two identifiers.   I discussed the limitations, risks, security and privacy concerns of performing an evaluation and management service by telephone and the availability of in person appointments. I also discussed with the patient that there may be a patient responsible charge related to this service. The patient expressed understanding and agreed to proceed.     I discussed the assessment and treatment plan with the patient. The patient was provided an opportunity to ask questions and all were answered. The patient agreed with the plan and demonstrated an understanding of the instructions.   The patient was advised to call back or seek an in-person evaluation if the symptoms worsen or if the condition fails to improve as anticipated.   BH MD OP Progress Note  08/23/2018 12:40 PM James Bryan  MRN:  [id number]  Chief Complaint:  Chief Complaint    Follow-up     HPI: James Bryan is a 58 year old Caucasian male, retired, [place], engaged, has a history of bipolar disorder, PTSD, panic attacks, chronic knee pain after total replacement of right knee joint, coronary artery disease was evaluated by phone today.  Patient today reports that he is currently making progress on the lithium.  He reports the lithium low-dose has been very helpful.  He reports his mood symptoms is more stable.  Patient reports his sleep is good.  He denies any suicidality, homicidality or perceptual disturbances.  He continues to be compliant with Seroquel and mirtazapine as prescribed.  Patient reports he continues to be in pain and continues to work with his pain provider.  He was started on buprenorphine which at this dosage is not helpful.  He reports he may have to start Suboxone at some point however he may have to find a new provider if  that is the case.  Discussed with patient about his lithium labs.  He reports he never got it done.  Will mail another lab slip to him today at his mailing address.  Patient denies any other concerns today.   Visit Diagnosis:    ICD-10-CM   1. Bipolar 1 disorder, mixed, mild (HCC) F31.61 QUEtiapine (SEROQUEL) 300 MG tablet    mirtazapine (REMERON) 30 MG tablet  2. PTSD (post-traumatic stress disorder) F43.10 mirtazapine (REMERON) 30 MG tablet  3. Panic attacks F41.0 mirtazapine (REMERON) 30 MG tablet    Past Psychiatric History: I have reviewed past psychiatric history from my progress note on 07/19/2018.  Past trials of Seroquel, trazodone, Ativan, mirtazapine, gabapentin  Past Medical History:  Past Medical History:  Diagnosis Date  . Anxiety   . Coronary artery disease   . Depression   . Hyperlipidemia   . Hypertension   . Myocardial infarction (HCC)   . PTSD (post-traumatic stress disorder)     Past Surgical History:  Procedure Laterality Date  . APPENDECTOMY    . JOINT REPLACEMENT Right    knee  . LUMBAR LAMINECTOMY  2012  . thumb surgery     reimplantation    Family Psychiatric History: I have reviewed family psychiatric history from my progress note on 07/19/2018.  Family History:  Family History  Problem Relation Age of Onset  . Diabetes Mother   . Depression Mother   . Alzheimer's disease Mother   . Diabetes Father   . Glaucoma Father  Social History: I have reviewed social history from my progress note on 07/19/2018. Social History   Socioeconomic History  . Marital status: Single    Spouse name: Not on file  . Number of children: Not on file  . Years of education: Not on file  . Highest education level: Not on file  Occupational History  . Not on file  Social Needs  . Financial resource strain: Not on file  . Food insecurity:    Worry: Not on file    Inability: Not on file  . Transportation needs:    Medical: Not on file    Non-medical:  Not on file  Tobacco Use  . Smoking status: Current Every Day Smoker    Packs/day: 0.25    Types: Cigarettes  . Smokeless tobacco: Never Used  Substance and Sexual Activity  . Alcohol use: No  . Drug use: No  . Sexual activity: Yes  Lifestyle  . Physical activity:    Days per week: Not on file    Minutes per session: Not on file  . Stress: Not on file  Relationships  . Social connections:    Talks on phone: Not on file    Gets together: Not on file    Attends religious service: Not on file    Active member of club or organization: Not on file    Attends meetings of clubs or organizations: Not on file    Relationship status: Not on file  Other Topics Concern  . Not on file  Social History Narrative  . Not on file    Allergies:  Allergies  Allergen Reactions  . Penicillins Anaphylaxis  . Sulfa Antibiotics Anaphylaxis  . Corticosteroids   . Meperidine     Other reaction(s): Unknown  . Prednisone     Metabolic Disorder Labs: No results found for: HGBA1C, MPG No results found for: PROLACTIN No results found for: CHOL, TRIG, HDL, CHOLHDL, VLDL, LDLCALC No results found for: TSH  Therapeutic Level Labs: No results found for: LITHIUM No results found for: VALPROATE No components found for:  CBMZ  Current Medications: Current Outpatient Medications  Medication Sig Dispense Refill  . aspirin EC 81 MG tablet Take 81 mg by mouth daily.    Marland Kitchen atorvastatin (LIPITOR) 40 MG tablet Take 40 mg by mouth daily.    . Buprenorphine 15 MCG/HR PTWK Place 15 mcg/hr onto the skin every 7 (seven) days for 30 days. 4 patch 0  . clopidogrel (PLAVIX) 75 MG tablet Take 75 mg by mouth daily.    Marland Kitchen gabapentin (NEURONTIN) 600 MG tablet TK 1 T PO BID AND 2 TS AT BEDTIME    . ibuprofen (ADVIL,MOTRIN) 600 MG tablet Take 1 tablet (600 mg total) by mouth every 8 (eight) hours as needed. 30 tablet 0  . lithium carbonate 150 MG capsule TAKE 1 CAPSULE BY MOUTH EVERY DAY WITH SUPPER 90 capsule 1  .  Melatonin 10 MG TABS Take 10 mg by mouth at bedtime.    . metoprolol succinate (TOPROL-XL) 100 MG 24 hr tablet Take 100 mg by mouth daily. Take with or immediately following a meal.    . mirtazapine (REMERON) 30 MG tablet Take 1 tablet (30 mg total) by mouth at bedtime. 90 tablet 0  . montelukast (SINGULAIR) 10 MG tablet Take 10 mg by mouth at bedtime.    . naproxen (NAPROSYN) 500 MG tablet Take 1 tablet (500 mg total) by mouth 2 (two) times daily with a meal. 20 tablet  2  . QUEtiapine (SEROQUEL) 300 MG tablet Take 2 tablets (600 mg total) by mouth at bedtime. 180 tablet 0  . sildenafil (REVATIO) 20 MG tablet Take three to five up to once daily as needed for erectile dysfunction.    . Vitamin D, Ergocalciferol, (DRISDOL) 50000 units CAPS capsule Take 50,000 Units by mouth every 7 (seven) days.     No current facility-administered medications for this visit.      Musculoskeletal: Strength & Muscle Tone: UTA Gait & Station: UTA Patient leans: N/A  Psychiatric Specialty Exam: Review of Systems  Psychiatric/Behavioral: The patient is nervous/anxious.   All other systems reviewed and are negative.   There were no vitals taken for this visit.There is no height or weight on file to calculate BMI.  General Appearance: UTA  Eye Contact:  UTA  Speech:  Clear and Coherent  Volume:  Normal  Mood:  Anxious Improving  Affect:  UTA  Thought Process:  Goal Directed and Descriptions of Associations: Intact  Orientation:  Full (Time, Place, and Person)  Thought Content: Logical   Suicidal Thoughts:  No  Homicidal Thoughts:  No  Memory:  Immediate;   Fair Recent;   Fair Remote;   Fair  Judgement:  Fair  Insight:  Fair  Psychomotor Activity:  Normal  Concentration:  Concentration: Fair and Attention Span: Fair  Recall:  [employer]Fair  Fund of Knowledge: Fair  Language: Fair  Akathisia:  No  Handed:  Right  AIMS (if indicated): denies tremors, rigidity,stiffness  Assets:  Communication  Skills Desire for Improvement Social Support  ADL's:  Intact  Cognition: WNL  Sleep:  Fair   Screenings: PHQ2-9     [facility]/11/2018 [facility]  PHQ-2 Total Score  0       Assessment and Plan: James Bryan is a 58 year old Caucasian male, engaged, retired, [place] in [place], has a history of bipolar disorder, PTSD, panic attacks, chronic pain, multiple surgeries, coronary artery disease was evaluated by phone today.  Patient biologically predisposed given his multiple medical problems, chronic pain as well as history of trauma.  Patient with psychosocial stressors of relationship struggles with with his daughter.  Patient currently is making progress on the lithium.  Discussed plan as noted below.  Plan For bipolar disorder-improving Continue Seroquel 600 mg p.o. nightly.  Patient reported today that he was taking 600 at night and not 300 mg as previously documented. Continue mirtazapine 30 mg p.o. nightly. Continue lithium 150 mg p.o. daily with supper. Will mail lab slip to him again- discussed getting lithium levels done.  [facility]  For panic attacks-improving Seroquel and mirtazapine as prescribed Patient has been referred for CBT.  For PTSD-improving Referred for CBT  For insomnia- improving Continue Seroquel as prescribed.  Follow-up in clinic in 1 month or sooner if needed.  Appointment scheduled for 2:15 PM, June 30.  I have spent atleast 25 minutes non face to face with patient today. More than 50 % of the time was spent for psychoeducation and supportive psychotherapy and care coordination.  This note was generated in part or whole with voice recognition software. Voice recognition is usually quite accurate but there are transcription errors that can and very often do occur. I apologize for any typographical errors that were not detected and corrected.       Jomarie LongsSaramma Lashika Erker, MD 08/23/2018, 12:40 PM

## 2018-09-09 ENCOUNTER — Ambulatory Visit: Payer: Medicare HMO | Admitting: Psychiatry

## 2018-09-18 ENCOUNTER — Telehealth: Payer: Self-pay | Admitting: Student in an Organized Health Care Education/Training Program

## 2018-09-18 NOTE — Telephone Encounter (Signed)
error 

## 2018-09-18 NOTE — Telephone Encounter (Signed)
Pt called stating his pain patch is due to be filled and changed today but his appt isn't until Monday. Do you want me to move his appt?

## 2018-09-19 ENCOUNTER — Encounter: Payer: Self-pay | Admitting: Student in an Organized Health Care Education/Training Program

## 2018-09-19 ENCOUNTER — Other Ambulatory Visit: Payer: Self-pay

## 2018-09-19 ENCOUNTER — Encounter: Payer: Medicare HMO | Admitting: Student in an Organized Health Care Education/Training Program

## 2018-09-19 ENCOUNTER — Ambulatory Visit
Payer: Medicare HMO | Attending: Student in an Organized Health Care Education/Training Program | Admitting: Student in an Organized Health Care Education/Training Program

## 2018-09-19 VITALS — BP 140/84 | HR 106 | Temp 98.1°F | Resp 16 | Ht 70.0 in | Wt 170.0 lb

## 2018-09-19 DIAGNOSIS — F319 Bipolar disorder, unspecified: Secondary | ICD-10-CM | POA: Insufficient documentation

## 2018-09-19 DIAGNOSIS — M5442 Lumbago with sciatica, left side: Secondary | ICD-10-CM | POA: Diagnosis not present

## 2018-09-19 DIAGNOSIS — M7502 Adhesive capsulitis of left shoulder: Secondary | ICD-10-CM | POA: Diagnosis present

## 2018-09-19 DIAGNOSIS — M5441 Lumbago with sciatica, right side: Secondary | ICD-10-CM | POA: Insufficient documentation

## 2018-09-19 DIAGNOSIS — M961 Postlaminectomy syndrome, not elsewhere classified: Secondary | ICD-10-CM | POA: Diagnosis not present

## 2018-09-19 DIAGNOSIS — Z9889 Other specified postprocedural states: Secondary | ICD-10-CM | POA: Insufficient documentation

## 2018-09-19 DIAGNOSIS — G8929 Other chronic pain: Secondary | ICD-10-CM | POA: Diagnosis present

## 2018-09-19 DIAGNOSIS — G894 Chronic pain syndrome: Secondary | ICD-10-CM | POA: Diagnosis not present

## 2018-09-19 DIAGNOSIS — I2510 Atherosclerotic heart disease of native coronary artery without angina pectoris: Secondary | ICD-10-CM | POA: Diagnosis present

## 2018-09-19 MED ORDER — BUPRENORPHINE 15 MCG/HR TD PTWK: 15.0000 ug/h | MEDICATED_PATCH | TRANSDERMAL | 2 refills | Status: AC

## 2018-09-19 MED ORDER — DICLOFENAC SODIUM 75 MG PO TBEC: 75.0000 mg | DELAYED_RELEASE_TABLET | Freq: Two times a day (BID) | ORAL | 2 refills | Status: DC | PRN

## 2018-09-19 NOTE — Progress Notes (Signed)
Nursing Pain Medication Assessment:  Safety precautions to be maintained throughout the outpatient stay will include: orient to surroundings, keep bed in low position, maintain call bell within reach at all times, provide assistance with transfer out of bed and ambulation.  Medication Inspection Compliance: Pill count conducted under aseptic conditions, in front of the patient. Neither the pills nor the bottle was removed from the patient's sight at any time. Once count was completed pills were immediately returned to the patient in their original bottle.  Medication: Buprenorphine (Suboxone) Pill/Patch Count: 0 of 0 patches remain Pill/Patch Appearance: Markings consistent with prescribed medication Bottle Appearance: Standard pharmacy container. Clearly labeled. Filled Date: 08/23/2018 Last Medication intake:  Today   Patient has on last patch and will due to be changed Friday.

## 2018-09-19 NOTE — Progress Notes (Signed)
Patient's Name: James Bryan  MRN: [id number]  Referring Provider: Marygrace Drought, MD  DOB: 1960/12/22  PCP: Marygrace Drought, MD  DOS: 09/19/2018  Note by: Gillis Santa, MD  Service setting: Ambulatory outpatient  Attending: Gillis Santa, MD  Location: [facility] (AMB) Pain Management Facility  Specialty: Interventional Pain Management  Patient type: Established   Primary Reason(s) for Visit: Encounter for prescription drug management. (Level of risk: moderate)  CC: Back Pain (lumbar right is worse) and Leg Pain (right )  HPI  James Bryan is a 58 y.o. year old, male patient, who comes today for a medication management evaluation. He has Adhesive capsulitis of left shoulder; Bipolar 1 disorder ([place]); Chronic back pain; Chronic knee pain after total replacement of right knee joint; Coronary artery disease involving native coronary artery of native heart without angina pectoris; and Shoulder joint pain on their problem list. His primarily concern today is the Back Pain (lumbar right is worse) and Leg Pain (right )  Pain Assessment: Location: Lower, Left, Right Back Radiating: down into right leg Onset: More than a month ago Duration: Chronic pain Quality: Discomfort, Sharp, Stabbing, Constant Severity: 3 /10 (subjective, self-reported pain score)  Note: Reported level is compatible with observation.                         When using our objective Pain Scale, levels between 6 and 10/10 are said to belong in an emergency room, as it progressively worsens from a 6/10, described as severely limiting, requiring emergency care not usually available at an outpatient pain management facility. At a 6/10 level, communication becomes difficult and requires great effort. Assistance to reach the emergency department may be required. Facial flushing and profuse sweating along with potentially dangerous increases in heart rate and blood pressure will be evident. Effect on ADL: activity has increased with increased  dosage.  able to perform housework and bending is much better.  more active over all Timing: Constant Modifying factors: increased dosage in the patch has made a big difference BP: 140/84  HR: (!) 106  James Bryan was last scheduled for an appointment on 09/18/2018 for medication management. During today's appointment we reviewed James Bryan's chronic pain status, as well as his outpatient medication regimen.  Patient follows up today for medication management.  Is obtaining significant pain relief on his buprenorphine patch at 50 mcg which was increased from 10 at the last visit.  He states that his baseline pain is better managed.  He is able to complete ADLs with less pain.  Patient is requesting something for breakthrough pain.  He does have pain flares that are unprovoked in his low back.  We discussed transitioning his NSAID.  I made sure that the patient was not having any bleeding episodes such as GI bleed, occult bleeding as a patient is on Plavix.  Patient denies.  Patient denies having tried diclofenac.  Can consider that for breakthrough pain.  The patient  reports no history of drug use. His body mass index is 24.39 kg/m.  Further details on both, my assessment(s), as well as the proposed treatment plan, please see below.  Controlled Substance Pharmacotherapy Assessment REMS (Risk Evaluation and Mitigation Strategy)   08/23/2018  3   08/22/2018  Buprenorphine 15 Mcg/Hr Patch  4.00 28 Bi Lat   [id number]   Nor (5579)   0  0.36 mg  Private Pay   [place]    Janett Billow, [place]  09/19/2018  8:44 AM  Sign when Signing Visit Nursing Pain Medication Assessment:  Safety precautions to be maintained throughout the outpatient stay will include: orient to surroundings, keep bed in low position, maintain call bell within reach at all times, provide assistance with transfer out of bed and ambulation.  Medication Inspection Compliance: Pill count conducted under aseptic conditions, in front of the  patient. Neither the pills nor the bottle was removed from the patient's sight at any time. Once count was completed pills were immediately returned to the patient in their original bottle.  Medication: Buprenorphine (Suboxone) Pill/Patch Count: 0 of 0 patches remain Pill/Patch Appearance: Markings consistent with prescribed medication Bottle Appearance: Standard pharmacy container. Clearly labeled. Filled Date: 08/23/2018 Last Medication intake:  Today   Patient has on last patch and will due to be changed Friday.    Pharmacokinetics: Liberation and absorption (onset of action): WNL Distribution (time to peak effect): WNL Metabolism and excretion (duration of action): WNL         Pharmacodynamics: Desired effects: Analgesia: James Bryan reports >50% benefit. Functional ability: Patient reports that medication allows him to accomplish basic ADLs Clinically meaningful improvement in function (CMIF): Sustained CMIF goals met Perceived effectiveness: Described as relatively effective, allowing for increase in activities of daily living (ADL) Undesirable effects: Side-effects or Adverse reactions: None reported Monitoring: [place] PMP: PDMP not reviewed this encounter. Online review of the past 82-monthperiod conducted. Compliant with practice rules and regulations Last UDS on record: Summary  Date Value Ref Range Status  06/11/2018 FINAL  Final    Comment:    ==================================================================== TOXASSURE COMP DRUG ANALYSIS,UR ==================================================================== Test                             Result       Flag       Units Drug Present   Hydrocodone                    41                      ng/mg creat   Hydromorphone                  103                     ng/mg creat   Dihydrocodeine                 37                      ng/mg creat   Norhydrocodone                 153                     ng/mg creat    Sources of  hydrocodone include scheduled prescription    medications. Hydromorphone, dihydrocodeine and norhydrocodone are    expected metabolites of hydrocodone. Hydromorphone and    dihydrocodeine are also available as scheduled prescription    medications.   Meprobamate                    PRESENT    Source of meprobamate is most commonly as a metabolite of    carisoprodol, but it is also available as a prescription    medication. Source of carisoprodol is a scheduled prescription  medication.   Cyclobenzaprine                PRESENT   Desmethylcyclobenzaprine       PRESENT    Desmethylcyclobenzaprine is an expected metabolite of    cyclobenzaprine.   Mirtazapine                    PRESENT   Quetiapine                     PRESENT   Acetaminophen                  PRESENT   Chlorpheniramine               PRESENT ==================================================================== Test                      Result    Flag   Units      Ref Range   Creatinine              177              mg/dL      >=20 ==================================================================== Declared Medications:  Medication list was not provided. ==================================================================== For clinical consultation, please call [phone]. ====================================================================    UDS interpretation: Compliant          Medication Assessment Form: Reviewed. Patient indicates being compliant with therapy Treatment compliance: Compliant Risk Assessment Profile: Aberrant behavior: See initial evaluations. None observed or detected today Comorbid factors increasing risk of overdose: See initial evaluation. No additional risks detected today Opioid risk tool (ORT):  Opioid Risk  07/29/2018  Alcohol 0  Illegal Drugs 0  Rx Drugs 0  Alcohol 0  Illegal Drugs 0  Rx Drugs 0  Age between 16-45 years  0  History of Preadolescent Sexual Abuse 0  Psychological  Disease 0  Depression 1  Opioid Risk Tool Scoring 1  Opioid Risk Interpretation Low Risk    ORT Scoring interpretation table:  Score <3 = Low Risk for SUD  Score between 4-7 = Moderate Risk for SUD  Score >8 = High Risk for Opioid Abuse   Risk of substance use disorder (SUD): Low  Risk Mitigation Strategies:  Patient Counseling: Covered Patient-Prescriber Agreement (PPA): Present and active  Notification to other healthcare providers: Done  Pharmacologic Plan: No change in therapy, at this time.             Laboratory Chemistry   SAFETY SCREENING Profile No results found for: SARSCOV2NAA, COVIDSOURCE, STAPHAUREUS, MRSAPCR, HCVAB, HIV, PREGTESTUR Inflammation Markers (CRP: Acute Phase) (ESR: Chronic Phase) No results found for: CRP, ESRSEDRATE, LATICACIDVEN                       Rheumatology Markers No results found for: RF, ANA, LABURIC, URICUR, LYMEIGGIGMAB, LYMEABIGMQN, HLAB27                      Renal Function Markers Lab Results  Component Value Date   BUN 10 03/18/2018   CREATININE 0.72 03/18/2018   GFRAA >60 03/18/2018   GFRNONAA >60 03/18/2018                             Hepatic Function Markers Lab Results  Component Value Date   AST 21 07/13/2017   ALT 10 (L) 07/13/2017   ALBUMIN 4.0 07/13/2017  ALKPHOS 145 (H) 07/13/2017                        Electrolytes Lab Results  Component Value Date   NA 136 03/18/2018   K 3.7 03/18/2018   CL 101 03/18/2018   CALCIUM 9.7 03/18/2018                        Coagulation Parameters Lab Results  Component Value Date   PLT 244 03/18/2018                        Cardiovascular Markers Lab Results  Component Value Date   TROPONINI <0.03 10/07/2017   HGB 13.9 03/18/2018   HCT 43.6 03/18/2018                          Note: Lab results reviewed.  Recent Diagnostic Imaging Results  CT RENAL STONE STUDY CLINICAL DATA:  58 year old male with 2 day history of back pain radiating to left flank with  hematuria. Partial nephrectomy for stone years ago. Prior appendectomy. Initial encounter.  EXAM: CT ABDOMEN AND PELVIS WITHOUT CONTRAST  TECHNIQUE: Multidetector CT imaging of the abdomen and pelvis was performed following the standard protocol without IV contrast.  COMPARISON:  08/25/2009 CT.  FINDINGS: Lower chest: Progressive lung parenchymal changes with minimal pleural thickening. Heart size within normal limits with prominent right coronary artery calcifications.  Hepatobiliary: Taking into account limitation by non contrast imaging, no worrisome hepatic lesion. No calcified gallstone or common bile duct stone. No CT evidence of gallbladder inflammation.  Pancreas: Taking into account limitation by non contrast imaging, no worrisome pancreatic mass or inflammation.  Spleen: Taking into account limitation by non contrast imaging, no splenic mass or enlargement.  Adrenals/Urinary Tract: No obstructing stone or hydronephrosis. Left lower pole 2 mm nonobstructing stone. Tiny nonobstructing right renal calculi. Low-density renal lesions possibly cysts but too small to characterize. Haziness of perirenal fat planes greater on the right slightly changed from prior exam and may reflect result of prior episodes of obstruction/inflammation.  Taking into account limitation by non contrast imaging, no worrisome renal or adrenal lesion.  Urinary bladder incompletely distended with mild wall thickening greatest anteriorly. No polypoid mass identified.  Stomach/Bowel: No extraluminal bowel inflammatory process noted. Portions of the stomach, small bowel and colon are under distended and portions of the colon stool filled limiting evaluation.  Vascular/Lymphatic: Atherosclerotic changes aorta and aortic branch vessels. No abdominal aortic aneurysm.  Scattered normal size lymph nodes.  Reproductive: Top-normal size prostate gland.  Other: No free air or bowel containing  hernia.  Musculoskeletal: Spinous process fusion device L4-5 level. Facet degenerative changes L3-4 and L4-5 with mild disc space narrowing. Mild curvature lumbar spine convex left.  IMPRESSION: 1. No obstructing stone or hydronephrosis. Left lower pole 2 mm nonobstructing stone. Tiny nonobstructing right renal calculi. 2. Low-density renal lesions possibly cysts but too small to characterize. 3. Urinary bladder incompletely distended with mild wall thickening greatest anteriorly. No polypoid mass identified. If there is persistent hematuria, further investigation may be indicated. 4. Prominent right coronary artery calcification. 5.  Aortic Atherosclerosis (ICD10-I70.0). 6. Progressive lung parenchymal changes including minimal pleural thickening. Elective chest CT with high-resolution imaging may be considered.  Electronically Signed   By: Genia Del M.D.   On: 03/18/2018 17:10  Complexity Note: Imaging results reviewed. Results shared with Mr. Causer,  using Layman's terms.                               Meds   Current Outpatient Medications:  .  aspirin EC 81 MG tablet, Take 81 mg by mouth daily., Disp: , Rfl:  .  atorvastatin (LIPITOR) 40 MG tablet, Take 40 mg by mouth daily., Disp: , Rfl:  .  Buprenorphine 15 MCG/HR PTWK, Place 15 mcg/hr onto the skin every 7 (seven) days for 30 days., Disp: 4 patch, Rfl: 2 .  clopidogrel (PLAVIX) 75 MG tablet, Take 75 mg by mouth daily., Disp: , Rfl:  .  gabapentin (NEURONTIN) 600 MG tablet, TK 1 T PO BID AND 2 TS AT BEDTIME, Disp: , Rfl:  .  lithium carbonate 150 MG capsule, TAKE 1 CAPSULE BY MOUTH EVERY DAY WITH SUPPER, Disp: 90 capsule, Rfl: 1 .  Melatonin 10 MG TABS, Take 10 mg by mouth at bedtime., Disp: , Rfl:  .  metoprolol succinate (TOPROL-XL) 100 MG 24 hr tablet, Take 100 mg by mouth daily. Take with or immediately following a meal., Disp: , Rfl:  .  mirtazapine (REMERON) 30 MG tablet, Take 1 tablet (30 mg total) by mouth at  bedtime., Disp: 90 tablet, Rfl: 0 .  montelukast (SINGULAIR) 10 MG tablet, Take 10 mg by mouth at bedtime., Disp: , Rfl:  .  QUEtiapine (SEROQUEL) 300 MG tablet, Take 2 tablets (600 mg total) by mouth at bedtime., Disp: 180 tablet, Rfl: 0 .  sildenafil (REVATIO) 20 MG tablet, Take three to five up to once daily as needed for erectile dysfunction., Disp: , Rfl:  .  Vitamin D, Ergocalciferol, (DRISDOL) 50000 units CAPS capsule, Take 50,000 Units by mouth every 7 (seven) days., Disp: , Rfl:  .  diclofenac (VOLTAREN) 75 MG EC tablet, Take 1 tablet (75 mg total) by mouth 2 (two) times daily as needed (pain flare)., Disp: 30 tablet, Rfl: 2  ROS  Constitutional: Denies any fever or chills Gastrointestinal: No reported hemesis, hematochezia, vomiting, or acute GI distress Musculoskeletal: Denies any acute onset joint swelling, redness, loss of ROM, or weakness Neurological: No reported episodes of acute onset apraxia, aphasia, dysarthria, agnosia, amnesia, paralysis, loss of coordination, or loss of consciousness  Allergies  James Bryan is allergic to penicillins; sulfa antibiotics; corticosteroids; meperidine; and prednisone.  [place]  Drug: James Bryan  reports no history of drug use. Alcohol:  reports no history of alcohol use. Tobacco:  reports that he has been smoking cigarettes. He has been smoking about 0.25 packs per day. He has never used smokeless tobacco. Medical:  has a past medical history of Anxiety, Coronary artery disease, Depression, Hyperlipidemia, Hypertension, Myocardial infarction ([place]), and PTSD (post-traumatic stress disorder). Surgical: James Bryan  has a past surgical history that includes Joint replacement (Right); Appendectomy; Lumbar laminectomy (2012); and thumb surgery. Family: family history includes Alzheimer's disease in his mother; Depression in his mother; Diabetes in his father and mother; Glaucoma in his father.  Constitutional Exam  General appearance: Well nourished,  well developed, and well hydrated. In no apparent acute distress Vitals:   09/19/18 0839  BP: 140/84  Pulse: (!) 106  Resp: 16  Temp: 98.1 F (36.7 C)  TempSrc: Oral  SpO2: 96%  Weight: 170 lb (77.1 kg)  Height: 5' 10"  (1.778 m)   BMI Assessment: Estimated body mass index is 24.39 kg/m as calculated from the following:   Height as of this encounter:  5' 10"  (1.778 m).   Weight as of this encounter: 170 lb (77.1 kg).  BMI interpretation table: BMI level Category Range association with higher incidence of chronic pain  <18 kg/m2 Underweight   18.5-24.9 kg/m2 Ideal body weight   25-29.9 kg/m2 Overweight Increased incidence by 20%  30-34.9 kg/m2 Obese (Class I) Increased incidence by 68%  35-39.9 kg/m2 Severe obesity (Class II) Increased incidence by 136%  >40 kg/m2 Extreme obesity (Class III) Increased incidence by 254%   Patient's current BMI Ideal Body weight  Body mass index is 24.39 kg/m. Ideal body weight: 73 kg (160 lb 15 oz) Adjusted ideal body weight: 74.6 kg (164 lb 9 oz)   BMI Readings from Last 4 Encounters:  09/19/18 24.39 kg/m  06/11/18 25.85 kg/m  06/05/18 25.99 kg/m  03/18/18 25.09 kg/m   Wt Readings from Last 4 Encounters:  09/19/18 170 lb (77.1 kg)  06/11/18 170 lb (77.1 kg)  06/05/18 176 lb (79.8 kg)  03/18/18 165 lb (74.8 kg)  Psych/Mental status: Alert, oriented x 3 (person, place, & time)       Eyes: PERLA Respiratory: No evidence of acute respiratory distress  Cervical Spine Area Exam  Skin & Axial Inspection: No masses, redness, edema, swelling, or associated skin lesions Alignment: Symmetrical Functional ROM: Decreased ROM      Stability: No instability detected Muscle Tone/Strength: Functionally intact. No obvious neuro-muscular anomalies detected. Sensory (Neurological): Musculoskeletal pain pattern Palpation: No palpable anomalies              Lumbar Spine Area Exam  Skin & Axial Inspection: No masses, redness, or  swelling Alignment: Symmetrical Functional ROM: Decreased ROM       Stability: No instability detected Muscle Tone/Strength: Functionally intact. No obvious neuro-muscular anomalies detected. Sensory (Neurological): Musculoskeletal pain pattern  *(Flexion, ABduction and External Rotation)  Gait & Posture Assessment  Ambulation: Unassisted Gait: Relatively normal for age and body habitus Posture: Difficulty standing up straight, due to pain    Assessment   Status Diagnosis  Controlled Controlled Controlled 1. Chronic pain syndrome   2. History of lumbar laminectomy   3. Post laminectomy syndrome   4. Chronic bilateral low back pain with bilateral sciatica   5. Bipolar 1 disorder ([place])   6. Adhesive capsulitis of left shoulder   7. Coronary artery disease involving native coronary artery of native heart without angina pectoris (on Plavix)       Continue buprenorphine as prescribed.  Discontinue ibuprofen, naproxen.  Prescription for Voltaren for breakthrough pain.  Counseled patient on bleeding risk patient denies any history of GI bleeding.  Continue gabapentin as prescribed.  Plan of Care  Pharmacotherapy (Medications Ordered): Meds ordered this encounter  Medications  . diclofenac (VOLTAREN) 75 MG EC tablet    Sig: Take 1 tablet (75 mg total) by mouth 2 (two) times daily as needed (pain flare).    Dispense:  30 tablet    Refill:  2  . Buprenorphine 15 MCG/HR PTWK    Sig: Place 15 mcg/hr onto the skin every 7 (seven) days for 30 days.    Dispense:  4 patch    Refill:  2    Do not place this medication, or any other prescription from our practice, on "Automatic Refill". Patient may have prescription filled one day early if pharmacy is closed on scheduled refill date.   Planned follow-up:   Return in about 3 months (around 12/20/2018) for Medication Management.      Recent Visits Date Type  Provider Dept  08/22/18 Office Visit Gillis Santa, MD [facility]   08/07/18 Office Visit Gillis Santa, MD [facility]  07/29/18 Office Visit Gillis Santa, MD [facility]  Showing recent visits within past 90 days and meeting all other requirements   Today's Visits Date Type Provider Dept  09/19/18 Office Visit Gillis Santa, MD [facility]  Showing today's visits and meeting all other requirements   Future Appointments No visits were found meeting these conditions.  Showing future appointments within next 90 days and meeting all other requirements   Primary Care Physician: Marygrace Drought, MD Location: [facility] Note by: Gillis Santa, MD Date: 09/19/2018; Time: 9:12 AM  Note: This dictation was prepared with Dragon dictation. Any transcriptional errors that may result from this process are unintentional.

## 2018-09-24 ENCOUNTER — Other Ambulatory Visit: Payer: Self-pay

## 2018-09-24 ENCOUNTER — Encounter: Payer: Medicare HMO | Admitting: Student in an Organized Health Care Education/Training Program

## 2018-09-24 ENCOUNTER — Ambulatory Visit: Payer: Medicare HMO | Admitting: Psychiatry

## 2018-12-04 ENCOUNTER — Telehealth: Payer: Self-pay

## 2018-12-04 DIAGNOSIS — Z79899 Other long term (current) drug therapy: Secondary | ICD-10-CM

## 2018-12-04 DIAGNOSIS — F3161 Bipolar disorder, current episode mixed, mild: Secondary | ICD-10-CM

## 2018-12-04 NOTE — Telephone Encounter (Signed)
pt called states he has not receieved his labwork orders yet and he ask if we can resend them. pt confirmed his address and states that it was correct even after i told him that it was [facility] return mail.  pt states it was correct address and if we can resend address.   Pt states he is having issues with his medications and he feels his lithium levels are off

## 2018-12-04 NOTE — Telephone Encounter (Signed)
Sending labs again.

## 2018-12-04 NOTE — Telephone Encounter (Signed)
Received call from patient that he has not gotten his lab slip yet.  This is the third time we are sending his labs to him.  Janett Billow CMA was able to verify his address.  We will send labs again- lithium level, CMP, TSH.

## 2018-12-04 NOTE — Telephone Encounter (Signed)
Mailed out certified mail

## 2018-12-16 ENCOUNTER — Telehealth: Payer: Self-pay | Admitting: *Deleted

## 2018-12-17 DIAGNOSIS — M7918 Myalgia, other site: Secondary | ICD-10-CM | POA: Insufficient documentation

## 2018-12-19 ENCOUNTER — Telehealth: Payer: Self-pay

## 2018-12-19 ENCOUNTER — Ambulatory Visit (INDEPENDENT_AMBULATORY_CARE_PROVIDER_SITE_OTHER): Payer: Medicare HMO | Admitting: Psychiatry

## 2018-12-19 ENCOUNTER — Encounter: Payer: Self-pay | Admitting: Psychiatry

## 2018-12-19 ENCOUNTER — Other Ambulatory Visit: Payer: Self-pay

## 2018-12-19 ENCOUNTER — Encounter: Payer: Medicare HMO | Admitting: Student in an Organized Health Care Education/Training Program

## 2018-12-19 DIAGNOSIS — F410 Panic disorder [episodic paroxysmal anxiety] without agoraphobia: Secondary | ICD-10-CM | POA: Diagnosis not present

## 2018-12-19 DIAGNOSIS — F4310 Post-traumatic stress disorder, unspecified: Secondary | ICD-10-CM

## 2018-12-19 DIAGNOSIS — F3161 Bipolar disorder, current episode mixed, mild: Secondary | ICD-10-CM | POA: Diagnosis not present

## 2018-12-19 MED ORDER — MIRTAZAPINE 30 MG PO TABS: 30.0000 mg | ORAL_TABLET | Freq: Every day | ORAL | 0 refills | Status: DC

## 2018-12-19 MED ORDER — TEMAZEPAM 15 MG PO CAPS: 15.0000 mg | ORAL_CAPSULE | Freq: Every evening | ORAL | 0 refills | Status: DC | PRN

## 2018-12-19 NOTE — Telephone Encounter (Signed)
he still not getting labwork order he gave me another address to send to. please reprint and sign and ill remail it out.

## 2018-12-19 NOTE — Telephone Encounter (Signed)
Printed out lab slip

## 2018-12-19 NOTE — Progress Notes (Signed)
Virtual Visit via Telephone Note  I connected with James Bryan on 12/19/18 at  1:45 PM EDT by telephone and verified that I am speaking with the correct person using two identifiers.   I discussed the limitations, risks, security and privacy concerns of performing an evaluation and management service by telephone and the availability of in person appointments. I also discussed with the patient that there may be a patient responsible charge related to this service. The patient expressed understanding and agreed to proceed.   I discussed the assessment and treatment plan with the patient. The patient was provided an opportunity to ask questions and all were answered. The patient agreed with the plan and demonstrated an understanding of the instructions.   The patient was advised to call back or seek an in-person evaluation if the symptoms worsen or if the condition fails to improve as anticipated. 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  Patient preferred to do a phone call.  Patient reports he continues to struggle with pain and that has been stressful for him.  That does have an impact on his mood.  He however reports he has been compliant with his medications as prescribed.  Patient reports he continues to struggle with sleep.  Patient reports he went to this new provider who prescribed him temazepam.  He reports he was started on 7.5 mg.  He reports the Seroquel or the mirtazapine is not helpful with his sleep.  He reports he is no longer on oxycodone or buprenorphine.  He reports he cannot tolerate either of those medications.  He reports he was  recently started on gabapentin for his pain.  He was also advised to take Cymbalta however he could not afford that he is not on it at this time.  Patient reports temazepam does help to some extent with sleep however he is interested in dose readjustment.  He reports he had tried temazepam in the past also and it was very beneficial for his sleep.  Patient denies any suicidality, homicidality or perceptual disturbances.  Patient reports he has not been able to get his lithium levels as discussed.  He however agrees to get it done soon.  Discussed with patient that his lithium cannot be readjusted unless his labs are done.   Visit Diagnosis:    ICD-10-CM   1. Bipolar 1 disorder, mixed, mild (HCC)  F31.61 mirtazapine (REMERON) 30 MG tablet  2. PTSD (post-traumatic stress disorder)  F43.10 temazepam (RESTORIL) 15 MG capsule    mirtazapine (REMERON) 30 MG tablet  3. Panic attacks  F41.0 mirtazapine (REMERON) 30 MG tablet    Past Psychiatric History: I have reviewed past psychiatric history from my progress note on 07/19/2018.  Past trials of Seroquel, trazodone, Ativan, mirtazapine, gabapentin  Past Medical History:  Past Medical History:  Diagnosis Date  . Anxiety   . Coronary artery disease   . Depression   . Hyperlipidemia   . Hypertension   . Myocardial infarction (HCC)   . PTSD (post-traumatic stress disorder)     Past Surgical History:  Procedure Laterality Date  . APPENDECTOMY    .  JOINT REPLACEMENT Right    knee  . LUMBAR LAMINECTOMY  2012  . thumb surgery     reimplantation    Family Psychiatric History: I have reviewed family psychiatric history from my progress note on 07/19/2018  Family History:  Family History  Problem Relation Age of Onset  . Diabetes Mother   . Depression Mother   . Alzheimer's disease Mother   . Diabetes Father   . Glaucoma Father     Social History: I have reviewed social history from my progress note on 07/19/2018 Social History    Socioeconomic History  . Marital status: Single    Spouse name: Not on file  . Number of children: Not on file  . Years of education: Not on file  . Highest education level: Not on file  Occupational History  . Not on file  Social Needs  . Financial resource strain: Not on file  . Food insecurity    Worry: Not on file    Inability: Not on file  . Transportation needs    Medical: Not on file    Non-medical: Not on file  Tobacco Use  . Smoking status: Current Every Day Smoker    Packs/day: 0.25    Types: Cigarettes  . Smokeless tobacco: Never Used  Substance and Sexual Activity  . Alcohol use: No  . Drug use: No  . Sexual activity: Yes  Lifestyle  . Physical activity    Days per week: Not on file    Minutes per session: Not on file  . Stress: Not on file  Relationships  . Social Herbalist on phone: Not on file    Gets together: Not on file    Attends religious service: Not on file    Active member of club or organization: Not on file    Attends meetings of clubs or organizations: Not on file    Relationship status: Not on file  Other Topics Concern  . Not on file  Social History Narrative  . Not on file    Allergies:  Allergies  Allergen Reactions  . Penicillins Anaphylaxis  . Sulfa Antibiotics Anaphylaxis  . Corticosteroids   . Meperidine     Other reaction(s): Unknown  . Prednisone     Metabolic Disorder Labs: No results found for: HGBA1C, MPG No results found for: PROLACTIN No results found for: CHOL, TRIG, HDL, CHOLHDL, VLDL, LDLCALC No results found for: TSH  Therapeutic Level Labs: No results found for: LITHIUM No results found for: VALPROATE No components found for:  CBMZ  Current Medications: Current Outpatient Medications  Medication Sig Dispense Refill  . Lidocaine 4 % PTCH Place onto the skin.    . meloxicam (MOBIC) 15 MG tablet Take by mouth.    . methocarbamol (ROBAXIN) 500 MG tablet Take by mouth.    . topiramate  (TOPAMAX) 50 MG tablet Take by mouth.    Marland Kitchen aspirin EC 81 MG tablet Take 81 mg by mouth daily.    Marland Kitchen atorvastatin (LIPITOR) 40 MG tablet Take 40 mg by mouth daily.    . clopidogrel (PLAVIX) 75 MG tablet Take 75 mg by mouth daily.    . diclofenac (VOLTAREN) 75 MG EC tablet Take 1 tablet (75 mg total) by mouth 2 (two) times daily as needed (pain flare). 30 tablet 2  . gabapentin (NEURONTIN) 600 MG tablet TK 1 T PO BID AND 2 TS AT BEDTIME    . lithium carbonate 150 MG capsule TAKE 1  CAPSULE BY MOUTH EVERY DAY WITH SUPPER 90 capsule 1  . Melatonin 10 MG TABS Take 10 mg by mouth at bedtime.    . metoprolol succinate (TOPROL-XL) 100 MG 24 hr tablet Take 100 mg by mouth daily. Take with or immediately following a meal.    . mirtazapine (REMERON) 30 MG tablet Take 1 tablet (30 mg total) by mouth at bedtime. 90 tablet 0  . montelukast (SINGULAIR) 10 MG tablet Take 10 mg by mouth at bedtime.    Marland Kitchen QUEtiapine (SEROQUEL) 300 MG tablet Take 2 tablets (600 mg total) by mouth at bedtime. 180 tablet 0  . sildenafil (REVATIO) 20 MG tablet Take three to five up to once daily as needed for erectile dysfunction.    . temazepam (RESTORIL) 15 MG capsule Take 1 capsule (15 mg total) by mouth at bedtime as needed for sleep. 30 capsule 0  . Vitamin D, Ergocalciferol, (DRISDOL) 50000 units CAPS capsule Take 50,000 Units by mouth every 7 (seven) days.     No current facility-administered medications for this visit.      Musculoskeletal: Strength & Muscle Tone: UTA Gait & Station: Reports as WNL Patient leans: N/A  Psychiatric Specialty Exam: Review of Systems  Psychiatric/Behavioral: The patient is nervous/anxious and has insomnia.   All other systems reviewed and are negative.   There were no vitals taken for this visit.There is no height or weight on file to calculate BMI.  General Appearance: Casual  Eye Contact:  Fair  Speech:  Clear and Coherent  Volume:  Normal  Mood:  Anxious  Affect:  Congruent   Thought Process:  Goal Directed and Descriptions of Associations: Intact  Orientation:  Full (Time, Place, and Person)  Thought Content: Logical   Suicidal Thoughts:  No  Homicidal Thoughts:  No  Memory:  Immediate;   Fair Recent;   Fair Remote;   Fair  Judgement:  Fair  Insight:  Fair  Psychomotor Activity:  UTA  Concentration:  Concentration: Fair and Attention Span: Fair  Recall:  [employer] of Knowledge: Fair  Language: Fair  Akathisia:  No  Handed:  Right  AIMS (if indicated): denies tremors, rigidity,stiffness  Assets:  Communication Skills Desire for Improvement Housing Social Support  ADL's:  Intact  Cognition: WNL  Sleep:  Poor   Screenings: PHQ2-9     Office Visit from 06/11/2018 in [facility]  PHQ-2 Total Score  0       Assessment and Plan: Tison is a 58 year old Caucasian male, engaged, retired, lives in [place] has a history of bipolar disorder, PTSD, panic attacks, chronic pain, multiple surgeries, coronary artery disease was evaluated by phone today.  Patient is biologically predisposed given his multiple health problems .  Patient with psychosocial stressors of his pain as well as health issues.  He continues to struggle with mood as well as sleep.  Discussed plan as noted below.  Plan Bipolar disorder-some progress Seroquel 600 mg p.o. nightly Continue mirtazapine 30 mg p.o. nightly Lithium 150 mg p.o. daily with supper Patient has been noncompliant with his lithium labs.  I have mailed lab slip to him several times in the past.  Patient request that it be mailed again today.  We will printed out and mailed to him today.  Discussed with patient that his lithium dosage cannot be readjusted unless his lithium levels are done.  For panic attacks- some progress Seroquel and mirtazapine as prescribed Patient was referred for CBT in  the past.  For insomnia- restless Patient reports he was recently started on  temazepam 7.5 mg p.o. nightly. He reports he had tried temazepam in the past and it has worked however he would like it to be readjusted to a higher dosage today. Start temazepam 15 mg p.o. nightly I have [place] Discussed with patient interaction between temazepam and opioid medications.  He is no longer on any opioid medications or buprenorphine.  Follow-up in clinic in 4 weeks or sooner if needed.  October 26 at 4:30 PM  I have spent atleast 15 minutes non face to face with patient today. More than 50 % of the time was spent for psychoeducation and supportive psychotherapy and care coordination. This note was generated in part or whole with voice recognition software. Voice recognition is usually quite accurate but there are transcription errors that can and very often do occur. I apologize for any typographical errors that were not detected and corrected.        James LongsSaramma Kaena Santori, MD 12/19/2018, 5:33 PM

## 2019-01-06 ENCOUNTER — Telehealth: Payer: Self-pay

## 2019-01-06 DIAGNOSIS — F3161 Bipolar disorder, current episode mixed, mild: Secondary | ICD-10-CM

## 2019-01-06 DIAGNOSIS — F4310 Post-traumatic stress disorder, unspecified: Secondary | ICD-10-CM

## 2019-01-06 DIAGNOSIS — F410 Panic disorder [episodic paroxysmal anxiety] without agoraphobia: Secondary | ICD-10-CM

## 2019-01-06 NOTE — Telephone Encounter (Signed)
pt wants to know the results of labwork.

## 2019-01-06 NOTE — Telephone Encounter (Signed)
Please call him and let him know I did not receive lab report .

## 2019-01-13 MED ORDER — QUETIAPINE FUMARATE 25 MG PO TABS: 25.0000 mg | ORAL_TABLET | Freq: Every day | ORAL | 1 refills | Status: DC | PRN

## 2019-01-13 NOTE — Telephone Encounter (Signed)
pt called states that he is having a lot of anxiety. states today is the when his grandson difed. pt also states he needs a refill on his medications.

## 2019-01-13 NOTE — Telephone Encounter (Signed)
Returned call to patient.  He reports this is the day that he picked up his grandson couple of years ago and he died of SIDS.  He found him that the next morning.  Patient reports he always have trouble this whole week during the anniversary with anxiety agitation and sleep problems.  He does have medications available however he would like to have something as needed.  Discussed adding a small dosage of Seroquel 25 mg as needed.  Discussed psychotherapy session referral again.  Patient however reports he does not think therapy sessions over the phone helps him.  He will make use of his support system.  Crisis plan discussed with patient.

## 2019-01-20 ENCOUNTER — Other Ambulatory Visit: Payer: Self-pay | Admitting: Psychiatry

## 2019-01-20 ENCOUNTER — Other Ambulatory Visit: Payer: Self-pay

## 2019-01-20 ENCOUNTER — Ambulatory Visit (INDEPENDENT_AMBULATORY_CARE_PROVIDER_SITE_OTHER): Payer: Medicare HMO | Admitting: Psychiatry

## 2019-01-20 ENCOUNTER — Encounter: Payer: Self-pay | Admitting: Psychiatry

## 2019-01-20 DIAGNOSIS — N289 Disorder of kidney and ureter, unspecified: Secondary | ICD-10-CM

## 2019-01-20 DIAGNOSIS — R7989 Other specified abnormal findings of blood chemistry: Secondary | ICD-10-CM | POA: Insufficient documentation

## 2019-01-20 DIAGNOSIS — F410 Panic disorder [episodic paroxysmal anxiety] without agoraphobia: Secondary | ICD-10-CM | POA: Diagnosis not present

## 2019-01-20 DIAGNOSIS — F4310 Post-traumatic stress disorder, unspecified: Secondary | ICD-10-CM

## 2019-01-20 DIAGNOSIS — F3161 Bipolar disorder, current episode mixed, mild: Secondary | ICD-10-CM

## 2019-01-20 MED ORDER — TEMAZEPAM 15 MG PO CAPS: 15.0000 mg | ORAL_CAPSULE | Freq: Every evening | ORAL | 0 refills | Status: DC | PRN

## 2019-01-20 MED ORDER — TEMAZEPAM 22.5 MG PO CAPS: 22.5000 mg | ORAL_CAPSULE | Freq: Every evening | ORAL | 0 refills | Status: DC | PRN

## 2019-01-20 MED ORDER — OXCARBAZEPINE 150 MG PO TABS: 150.0000 mg | ORAL_TABLET | Freq: Two times a day (BID) | ORAL | 1 refills | Status: DC

## 2019-01-20 MED ORDER — QUETIAPINE FUMARATE 300 MG PO TABS: 300.0000 mg | ORAL_TABLET | Freq: Every day | ORAL | 0 refills | Status: DC

## 2019-01-20 MED ORDER — TEMAZEPAM 7.5 MG PO CAPS: 7.5000 mg | ORAL_CAPSULE | Freq: Every evening | ORAL | 0 refills | Status: DC | PRN

## 2019-01-20 NOTE — Progress Notes (Signed)
Virtual Visit via Telephone Note  I connected with James Bryan on 01/20/19 at  4:30 PM EDT by telephone and verified that I am speaking with the correct person using two identifiers.   I discussed the limitations, risks, security and privacy concerns of performing an evaluation and management service by telephone and the availability of in person appointments. I also discussed with the patient that there may be a patient responsible charge related to this service. The patient expressed understanding and agreed to proceed.    I discussed the assessment and treatment plan with the patient. The patient was provided an opportunity to ask questions and all were answered. The patient agreed with the plan and demonstrated an understanding of the instructions.   The patient was advised to call back or seek an in-person evaluation if the symptoms worsen or if the condition fails to improve as anticipated.   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  Patient preferred to do a phone call.  Patient today reports he continues to struggle with anxiety symptoms.  He reports the death anniversary of his grandson went okay.  He was  able to make use of his support system and also celebrated his grand son's short life rather than focusing on his death and that helped.  Patient reports he continues to struggle with sleep.  He reports he sleeps okay at night however during the day he feels sluggish, tired all the time.  He does not think he snores or has apneic episodes at night.  He did have a sleep study several years ago which came back negative.  He is on multiple  medications at bedtime including a high dosage of Seroquel, mirtazapine as well as temazepam.  Discussed with patient about readjusting some of his nighttime medication to see if that will help.  Also reviewed and discussed his lab work-up that was recently done on 01/03/2019.  Patient with elevated TSH as well as abnormalities of his renal function.  Discussed with him that lithium could be contributing to it.  We will stop lithium.  However advised him to reach out to his primary care provider to follow-up on the same.  Patient denies any suicidality, homicidality or perceptual disturbances. Visit Diagnosis:    ICD-10-CM   1. Bipolar 1 disorder, mixed, mild (HCC)  F31.61 QUEtiapine (SEROQUEL) 300 MG tablet    OXcarbazepine (TRILEPTAL) 150 MG tablet   improving  2. PTSD (post-traumatic stress disorder)  F43.10 QUEtiapine (SEROQUEL) 300 MG tablet    OXcarbazepine (TRILEPTAL) 150 MG tablet    temazepam (RESTORIL) 22.5 MG capsule   improving  3. Panic attacks  F41.0 OXcarbazepine (TRILEPTAL) 150 MG tablet  4. Elevated TSH  R79.89   5. Abnormal renal function  N28.9     Past Psychiatric History: I have reviewed past psychiatric history from my progress note on 07/19/2018.  Past trials of Seroquel, trazodone, Ativan, mirtazapine, gabapentin  Past Medical History:  Past Medical History:  Diagnosis Date  . Anxiety   . Coronary artery disease   . Depression   . Hyperlipidemia   . Hypertension   . Myocardial infarction (  HCC)   . PTSD (post-traumatic stress disorder)     Past Surgical History:  Procedure Laterality Date  . APPENDECTOMY    . JOINT REPLACEMENT Right    knee  . LUMBAR LAMINECTOMY  2012  . thumb surgery     reimplantation    Family Psychiatric History: I have reviewed family psychiatric history from my progress note on 07/19/2018. Family History:  Family History  Problem Relation Age of Onset  . Diabetes Mother   . Depression Mother   . Alzheimer's disease Mother   .  Diabetes Father   . Glaucoma Father     Social History: I have reviewed social history from my progress note on 07/19/2018. Social History   Socioeconomic History  . Marital status: Single    Spouse name: Not on file  . Number of children: Not on file  . Years of education: Not on file  . Highest education level: Not on file  Occupational History  . Not on file  Social Needs  . Financial resource strain: Not on file  . Food insecurity    Worry: Not on file    Inability: Not on file  . Transportation needs    Medical: Not on file    Non-medical: Not on file  Tobacco Use  . Smoking status: Current Every Day Smoker    Packs/day: 0.25    Types: Cigarettes  . Smokeless tobacco: Never Used  Substance and Sexual Activity  . Alcohol use: No  . Drug use: No  . Sexual activity: Yes  Lifestyle  . Physical activity    Days per week: Not on file    Minutes per session: Not on file  . Stress: Not on file  Relationships  . Social Musician on phone: Not on file    Gets together: Not on file    Attends religious service: Not on file    Active member of club or organization: Not on file    Attends meetings of clubs or organizations: Not on file    Relationship status: Not on file  Other Topics Concern  . Not on file  Social History Narrative  . Not on file    Allergies:  Allergies  Allergen Reactions  . Penicillins Anaphylaxis  . Sulfa Antibiotics Anaphylaxis  . Corticosteroids   . Meperidine     Other reaction(s): Unknown  . Prednisone     Metabolic Disorder Labs: No results found for: HGBA1C, MPG No results found for: PROLACTIN No results found for: CHOL, TRIG, HDL, CHOLHDL, VLDL, LDLCALC No results found for: TSH  Therapeutic Level Labs: No results found for: LITHIUM No results found for: VALPROATE No components found for:  CBMZ  Current Medications: Current Outpatient Medications  Medication Sig Dispense Refill  . aspirin EC 81 MG tablet Take  81 mg by mouth daily.    Marland Kitchen atorvastatin (LIPITOR) 40 MG tablet Take 40 mg by mouth daily.    . clopidogrel (PLAVIX) 75 MG tablet Take 75 mg by mouth daily.    . diclofenac (VOLTAREN) 75 MG EC tablet Take 1 tablet (75 mg total) by mouth 2 (two) times daily as needed (pain flare). 30 tablet 2  . gabapentin (NEURONTIN) 600 MG tablet TK 1 T PO BID AND 2 TS AT BEDTIME    . Melatonin 10 MG TABS Take 10 mg by mouth at bedtime.    . meloxicam (MOBIC) 15 MG tablet Take by mouth.    . metoprolol succinate (TOPROL-XL)  100 MG 24 hr tablet Take 100 mg by mouth daily. Take with or immediately following a meal.    . mirtazapine (REMERON) 30 MG tablet Take 1 tablet (30 mg total) by mouth at bedtime. 90 tablet 0  . montelukast (SINGULAIR) 10 MG tablet Take 10 mg by mouth at bedtime.    . OXcarbazepine (TRILEPTAL) 150 MG tablet Take 1 tablet (150 mg total) by mouth 2 (two) times daily. Mood stabilizer 60 tablet 1  . QUEtiapine (SEROQUEL) 300 MG tablet Take 1 tablet (300 mg total) by mouth at bedtime. 90 tablet 0  . sildenafil (REVATIO) 20 MG tablet Take three to five up to once daily as needed for erectile dysfunction.    . temazepam (RESTORIL) 22.5 MG capsule Take 1 capsule (22.5 mg total) by mouth at bedtime as needed for sleep. 30 capsule 0  . topiramate (TOPAMAX) 50 MG tablet Take by mouth.    . Vitamin D, Ergocalciferol, (DRISDOL) 50000 units CAPS capsule Take 50,000 Units by mouth every 7 (seven) days.     No current facility-administered medications for this visit.      Musculoskeletal: Strength & Muscle Tone: UTA Gait & Station: Reports as WNL Patient leans: N/A  Psychiatric Specialty Exam: Review of Systems  Psychiatric/Behavioral: The patient is nervous/anxious and has insomnia.   All other systems reviewed and are negative.   There were no vitals taken for this visit.There is no height or weight on file to calculate BMI.  General Appearance: UTA  Eye Contact:  UTA  Speech:  Clear and  Coherent  Volume:  Normal  Mood:  Anxious  Affect:  UTA  Thought Process:  Goal Directed and Descriptions of Associations: Intact  Orientation:  Full (Time, Place, and Person)  Thought Content: Logical   Suicidal Thoughts:  No  Homicidal Thoughts:  No  Memory:  Immediate;   Fair Recent;   Fair Remote;   Fair  Judgement:  Fair  Insight:  Fair  Psychomotor Activity:  UTA  Concentration:  Concentration: Fair and Attention Span: Fair  Recall:  [employer] of Knowledge: Fair  Language: Fair  Akathisia:  No  Handed:  Right  AIMS (if indicated): Denies tremors, rigidity  Assets:  Communication Skills Desire for Improvement Housing Social Support  ADL's:  Intact  Cognition: WNL  Sleep:  Poor   Screenings: PHQ2-9     Office Visit from 06/11/2018 in [place]  PHQ-2 Total Score  0       Assessment and Plan: Jered is a 58 year old Caucasian male, engaged, retired, lives in [place] has a history of bipolar disorder, PTSD, panic attacks, chronic pain, multiple surgeries, coronary artery disease was evaluated by phone today.  Patient is biologically predisposed given his multiple health issues.  Patient with psychosocial stressors of his pain.  Patient with abnormal labs likely due to lithium.  He will continue to need medication readjustment for anxiety as well as sleep.  Plan Bipolar disorder-improving Reduce  Seroquel to 300 mg p.o. nightly Mirtazapine 30 mg p.o. nightly Discontinue lithium for abnormal labs. Start Trileptal 150 mg p.o. twice daily.  Panic attacks-improving Seroquel and mirtazapine as prescribed Patient was referred for CBT however he reports he does not want to do virtual visits with therapist and want to wait.  Insomnia-restless Patient with sleep okay at night however during the day he is lethargic, fatigued and sluggish. It is possible patient is on multiple medications at bedtime which could be contributing  to  it.  Hence will readjust his medications. Reduce Seroquel to 300 mg p.o. nightly Increase temazepam to 22.5 mg p.o. nightly as needed I have reviewed [place] controlled substance database.    For elevated TSH and abnormal renal function-patient advised to make an appointment with his primary care provider. TSH - 10.2, Creatinine - 1.72, BUN - 29 , Li level - 0.5 .   Follow-up in clinic in 4 weeks or sooner if needed.  Nov 23rd at 3 PM  I have spent atleast 25 minutes non  face to face with patient today. More than 50 % of the time was spent for psychoeducation and supportive psychotherapy and care coordination. This note was generated in part or whole with voice recognition software. Voice recognition is usually quite accurate but there are transcription errors that can and very often do occur. I apologize for any typographical errors that were not detected and corrected.      Jomarie Longs, MD 01/20/2019, 5:04 PM

## 2019-01-20 NOTE — Telephone Encounter (Signed)
Sent temazepam 7.5 and 15 mg

## 2019-01-21 ENCOUNTER — Telehealth: Payer: Self-pay

## 2019-01-21 DIAGNOSIS — F4310 Post-traumatic stress disorder, unspecified: Secondary | ICD-10-CM

## 2019-01-21 MED ORDER — TEMAZEPAM 15 MG PO CAPS: 30.0000 mg | ORAL_CAPSULE | Freq: Every evening | ORAL | 0 refills | Status: DC | PRN

## 2019-01-21 NOTE — Telephone Encounter (Signed)
pt called  states that the medication you sent in yesterday will cost him over $200.00 pt states that the pharmacy to him that if you sent in a tablet form it will be cover.

## 2019-01-21 NOTE — Telephone Encounter (Signed)
Returned call to patient. Temazepam 22.5 mg is too costly. He is wondering if it can be changed to 30 mg , 2  of the 15 mg pills . Will send to pharmacy with new dosage.

## 2019-01-25 IMAGING — CT CT RENAL STONE PROTOCOL
2 of 4 series · 15 of 46 positions shown, 17 images · non-contrast
Comparison: 08/25/2009 CT.

CLINICAL DATA: 57-year-old male with 2 day history of back pain
radiating to left flank with hematuria. Partial nephrectomy for
stone years ago. Prior appendectomy. Initial encounter.

EXAM:
CT ABDOMEN AND PELVIS WITHOUT CONTRAST
TECHNIQUE: Multidetector CT imaging of the abdomen and pelvis was performed
following the standard protocol without IV contrast.

[Series 2: stone full standard · axial · 0.78mm/px · z∈[-718,-283]mm · 12 of 95 slices shown, 14 images]
[im 4/95  soft-tissue]
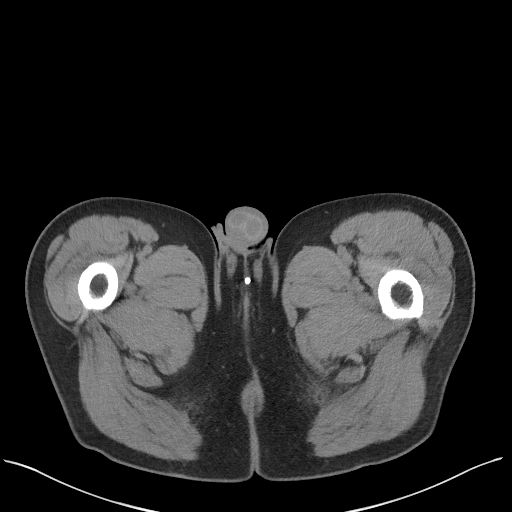
[im 4/95  bone]
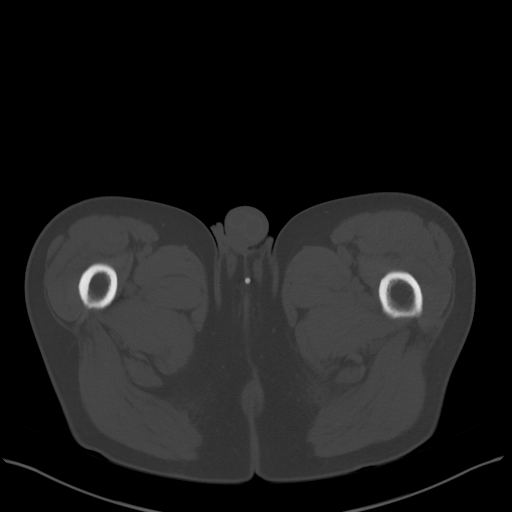
[im 12/95  soft-tissue]
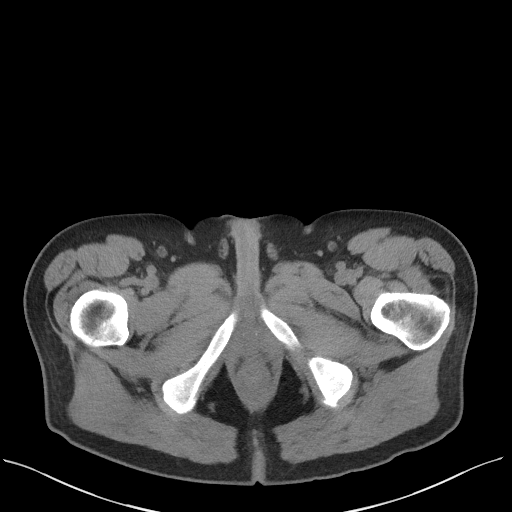
[im 20/95  soft-tissue]
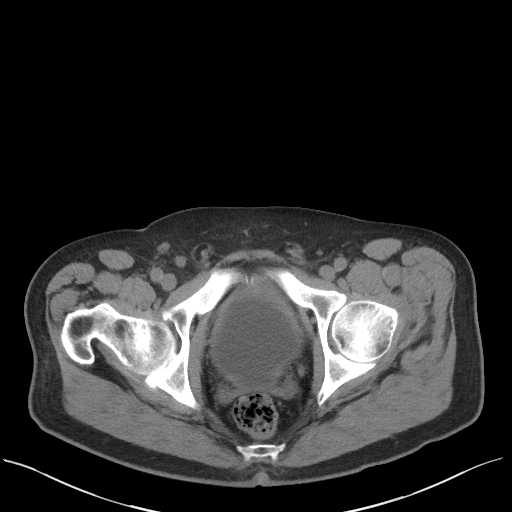
[im 28/95  soft-tissue]
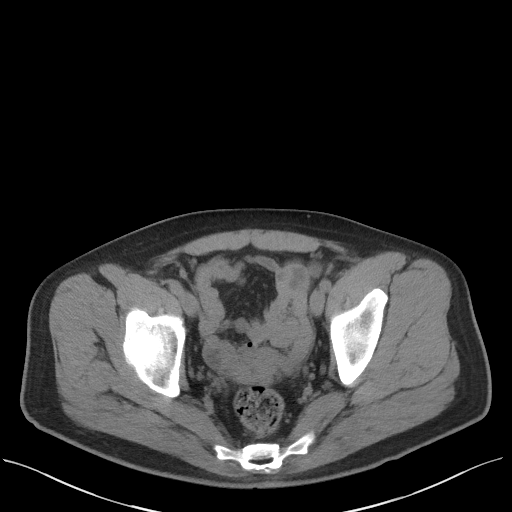
[im 36/95  soft-tissue]
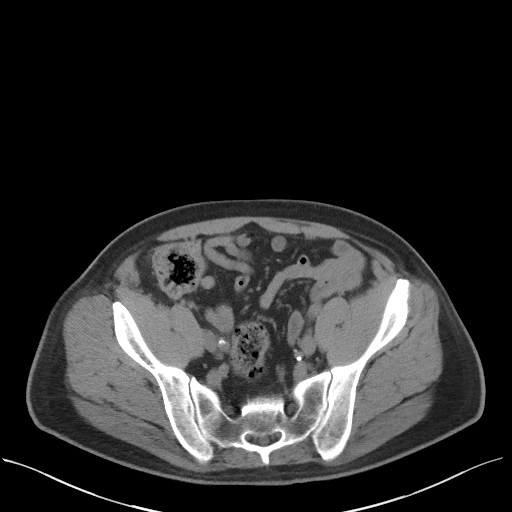
[im 44/95  soft-tissue]
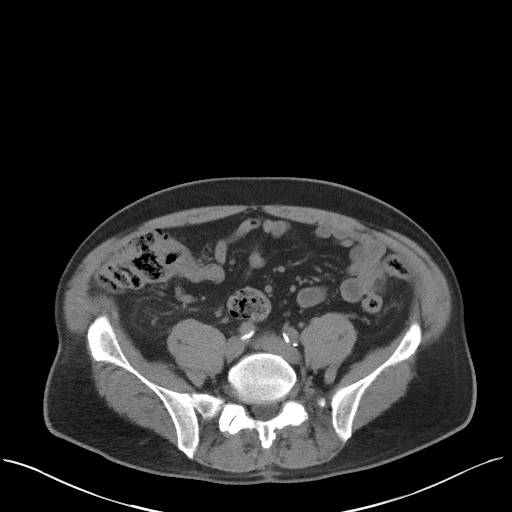
[im 51/95  soft-tissue]
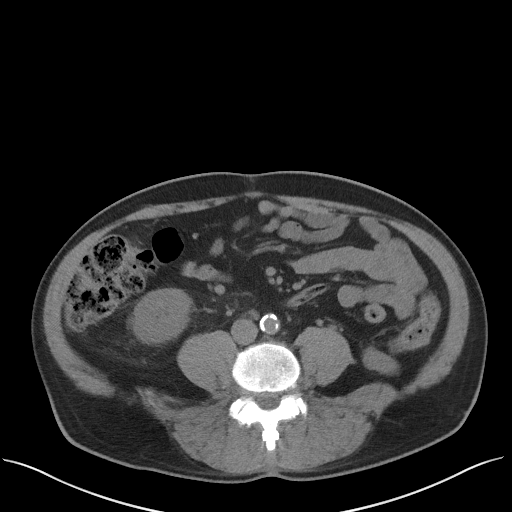
[im 59/95  soft-tissue]
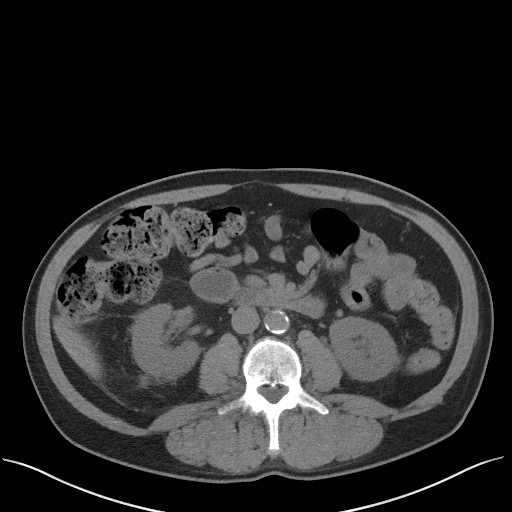
[im 67/95  soft-tissue]
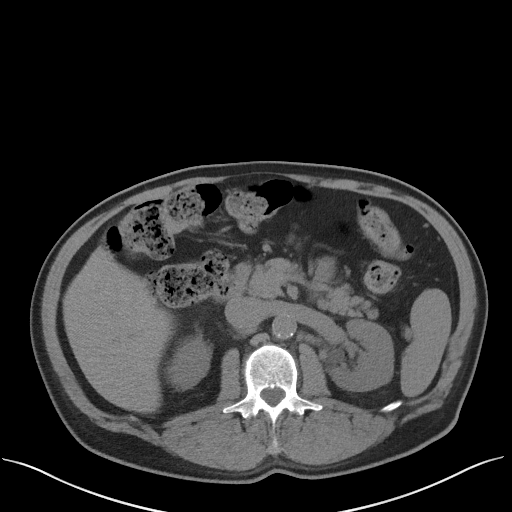
[im 67/95  bone]
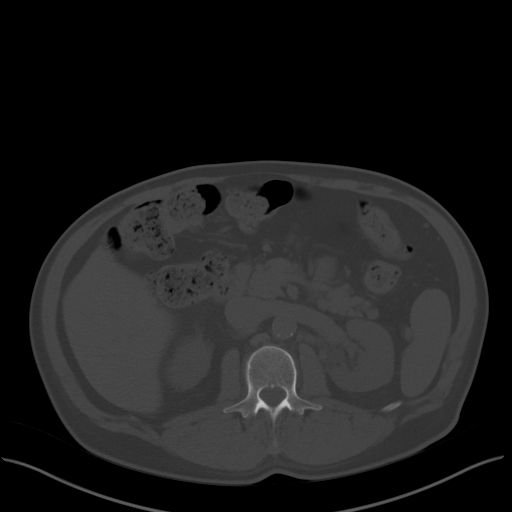
[im 75/95  soft-tissue]
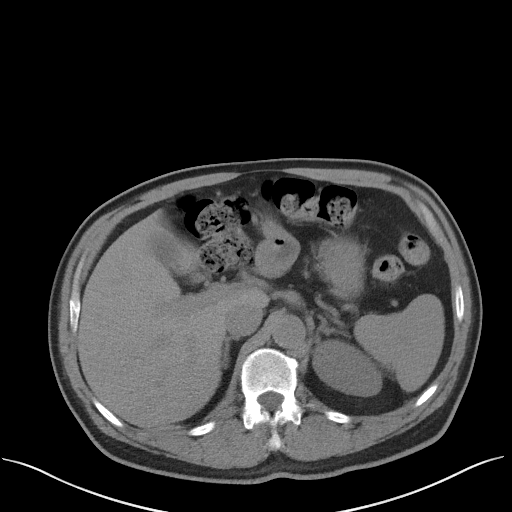
[im 83/95  soft-tissue]
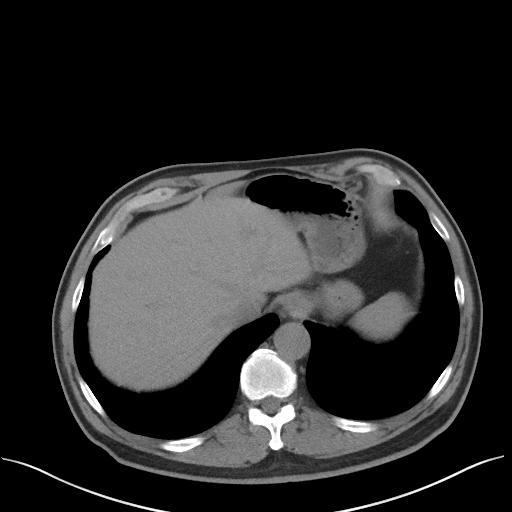
[im 91/95  soft-tissue]
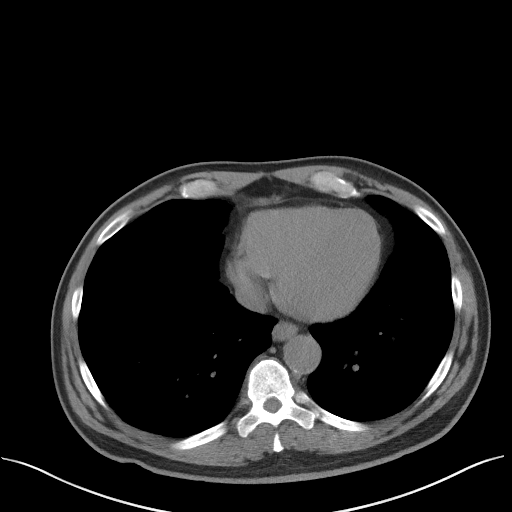

[Series 5: coronal · coronal · 0.77mm/px · 3 of 126 slices shown]
[im 42/126  soft-tissue]
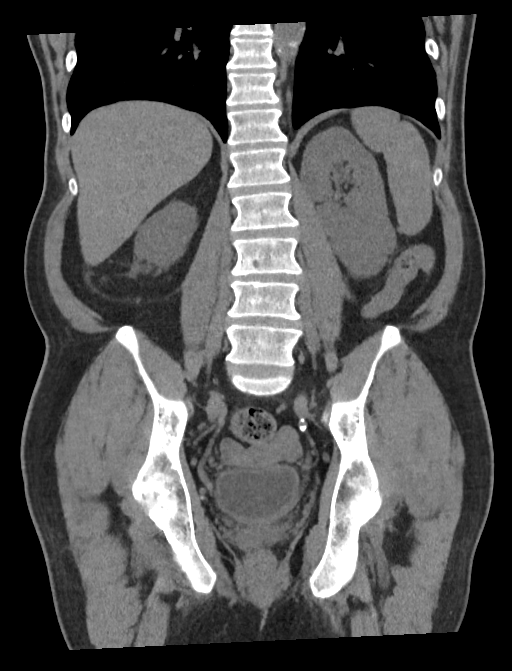
[im 56/126  soft-tissue]
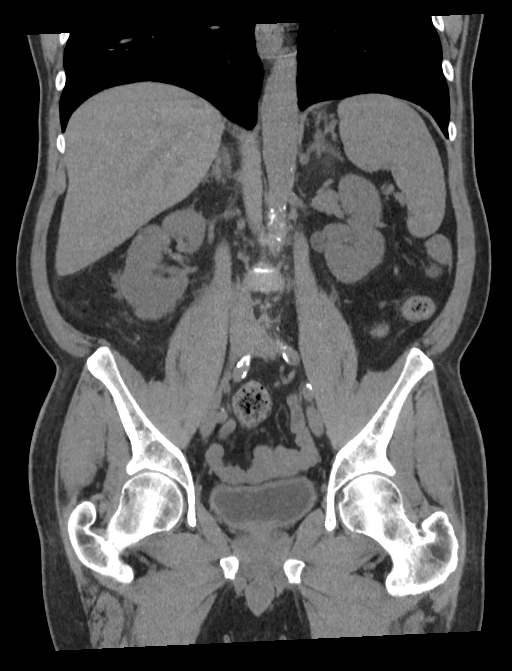
[im 70/126  soft-tissue]
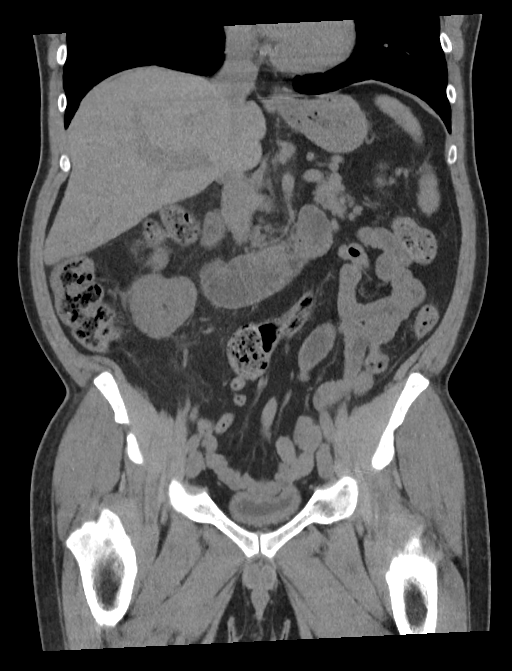

[15 of 46 positions shown; findings below may reference images not displayed]

FINDINGS: Lower chest: Progressive lung parenchymal changes with minimal
pleural thickening. Heart size within normal limits with prominent
right coronary artery calcifications.

Hepatobiliary: Taking into account limitation by non contrast
imaging, no worrisome hepatic lesion. No calcified gallstone or
common bile duct stone. No CT evidence of gallbladder inflammation.

Pancreas: Taking into account limitation by non contrast imaging, no
worrisome pancreatic mass or inflammation.

Spleen: Taking into account limitation by non contrast imaging, no
splenic mass or enlargement.

Adrenals/Urinary Tract: No obstructing stone or hydronephrosis. Left
lower pole 2 mm nonobstructing stone. Tiny nonobstructing right
renal calculi. Low-density renal lesions possibly cysts but too
small to characterize. Haziness of perirenal fat planes greater on
the right slightly changed from prior exam and may reflect result of
prior episodes of obstruction/inflammation.

Taking into account limitation by non contrast imaging, no worrisome
renal or adrenal lesion.

Urinary bladder incompletely distended with mild wall thickening
greatest anteriorly. No polypoid mass identified.

Stomach/Bowel: No extraluminal bowel inflammatory process noted.
Portions of the stomach, small bowel and colon are under distended
and portions of the colon stool filled limiting evaluation.

Vascular/Lymphatic: Atherosclerotic changes aorta and aortic branch
vessels. No abdominal aortic aneurysm.

Scattered normal size lymph nodes.

Reproductive: Top-normal size prostate gland.

Other: No free air or bowel containing hernia.

Musculoskeletal: Spinous process fusion device L4-5 level. Facet
degenerative changes L3-4 and L4-5 with mild disc space narrowing.
Mild curvature lumbar spine convex left.
IMPRESSION: 1. No obstructing stone or hydronephrosis. Left lower pole 2 mm
nonobstructing stone. Tiny nonobstructing right renal calculi.
2. Low-density renal lesions possibly cysts but too small to
characterize.
3. Urinary bladder incompletely distended with mild wall thickening
greatest anteriorly. No polypoid mass identified. If there is
persistent hematuria, further investigation may be indicated.
4. Prominent right coronary artery calcification.
5.  Aortic Atherosclerosis ([id number]).
6. Progressive lung parenchymal changes including minimal pleural
thickening. Elective chest CT with high-resolution imaging may be
considered.

## 2019-02-11 ENCOUNTER — Other Ambulatory Visit: Payer: Self-pay | Admitting: Psychiatry

## 2019-02-11 DIAGNOSIS — F410 Panic disorder [episodic paroxysmal anxiety] without agoraphobia: Secondary | ICD-10-CM

## 2019-02-11 DIAGNOSIS — F3161 Bipolar disorder, current episode mixed, mild: Secondary | ICD-10-CM

## 2019-02-11 DIAGNOSIS — F4310 Post-traumatic stress disorder, unspecified: Secondary | ICD-10-CM

## 2019-02-14 ENCOUNTER — Other Ambulatory Visit: Payer: Self-pay | Admitting: Psychiatry

## 2019-02-14 DIAGNOSIS — F3161 Bipolar disorder, current episode mixed, mild: Secondary | ICD-10-CM

## 2019-02-17 ENCOUNTER — Ambulatory Visit (INDEPENDENT_AMBULATORY_CARE_PROVIDER_SITE_OTHER): Payer: Medicare HMO | Admitting: Psychiatry

## 2019-02-17 ENCOUNTER — Encounter: Payer: Self-pay | Admitting: Psychiatry

## 2019-02-17 ENCOUNTER — Other Ambulatory Visit: Payer: Self-pay

## 2019-02-17 DIAGNOSIS — F4310 Post-traumatic stress disorder, unspecified: Secondary | ICD-10-CM

## 2019-02-17 DIAGNOSIS — F410 Panic disorder [episodic paroxysmal anxiety] without agoraphobia: Secondary | ICD-10-CM | POA: Diagnosis not present

## 2019-02-17 DIAGNOSIS — F3161 Bipolar disorder, current episode mixed, mild: Secondary | ICD-10-CM

## 2019-02-17 DIAGNOSIS — F17200 Nicotine dependence, unspecified, uncomplicated: Secondary | ICD-10-CM

## 2019-02-17 MED ORDER — OXCARBAZEPINE 300 MG PO TABS: 300.0000 mg | ORAL_TABLET | Freq: Two times a day (BID) | ORAL | 1 refills | Status: DC

## 2019-02-17 MED ORDER — MIRTAZAPINE 30 MG PO TABS: 30.0000 mg | ORAL_TABLET | Freq: Every day | ORAL | 0 refills | Status: DC

## 2019-02-17 MED ORDER — VARENICLINE TARTRATE 0.5 MG PO TABS: 0.5000 mg | ORAL_TABLET | Freq: Two times a day (BID) | ORAL | 1 refills | Status: DC

## 2019-02-17 NOTE — Progress Notes (Signed)
Virtual Visit via Telephone Note  I connected with James Bryan on 02/17/19 at  3:00 PM EST by telephone and verified that I am speaking with the correct person using two identifiers.   I discussed the limitations, risks, security and privacy concerns of performing an evaluation and management service by telephone and the availability of in person appointments. I also discussed with the patient that there may be a patient responsible charge related to this service. The patient expressed understanding and agreed to proceed.      I discussed the assessment and treatment plan with the patient. The patient was provided an opportunity to ask questions and all were answered. The patient agreed with the plan and demonstrated an understanding of the instructions.   The patient was advised to call back or seek an in-person evaluation if the symptoms worsen or if the condition fails to improve as anticipated.   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  Patient preferred to do a phone call.  Patient today reports he is making progress however continues to struggle with irritability and mood swings.  He is tolerating the Trileptal well.  He reports it is very beneficial however he wants to go up on the dosage.  He denies any side effects.  Patient reports sleep continues to be restless.  He takes Seroquel as well as mirtazapine and temazepam at bedtime.  The 15 mg does not help him much.  He is hence working on sleep hygiene techniques which works to some extent.  He is however interested in a dosage increase.  Patient reports he is also interested  in smoking cessation.  He would like to try Chantix.  Patient denies any suicidality, homicidality or perceptual disturbances.  Patient with abnormal renal function as well as TSH abnormality while on lithium, was taken off of lithium last visit.  Patient had repeat labs done at his primary care office.  Patient reports his lab function returned to normal since being off of the lithium.  I have reviewed CMP as well as TSH done on 01/30/2019-within normal limits.  Patient denies any other concerns today.     Visit Diagnosis:    ICD-10-CM   1. Bipolar 1 disorder, mixed, mild (HCC)  F31.61 mirtazapine (REMERON) 30 MG tablet    Oxcarbazepine (TRILEPTAL) 300 MG tablet   improving  2. PTSD (post-traumatic stress disorder)  F43.10 mirtazapine (REMERON) 30 MG tablet    Oxcarbazepine (TRILEPTAL) 300 MG tablet   stable  3. Panic attacks  F41.0 mirtazapine (REMERON) 30 MG tablet   stable  4. Tobacco use disorder  F17.200 varenicline (CHANTIX) 0.5 MG tablet    Past Psychiatric History: Reviewed past psychiatric history from my progress note on 07/19/2018.  Past trials of Seroquel, trazodone, Ativan, mirtazapine, gabapentin  Past Medical History:  Past Medical History:  Diagnosis Date  . Anxiety   . Coronary artery disease   . Depression   . Hyperlipidemia   . Hypertension   . Myocardial infarction (HCC)   . PTSD (post-traumatic stress disorder)     Past Surgical History:  Procedure Laterality Date  . APPENDECTOMY    .  JOINT REPLACEMENT Right    knee  . LUMBAR LAMINECTOMY  2012  . thumb surgery     reimplantation    Family Psychiatric History: I have reviewed family psychiatric history from my progress note on 07/19/2018  Family History:  Family History  Problem Relation Age of Onset  . Diabetes Mother   . Depression Mother   . Alzheimer's disease Mother   . Diabetes Father   . Glaucoma Father     Social History: Reviewed social history from my progress note on  07/19/2018 Social History   Socioeconomic History  . Marital status: Single    Spouse name: Not on file  . Number of children: Not on file  . Years of education: Not on file  . Highest education level: Not on file  Occupational History  . Not on file  Social Needs  . Financial resource strain: Not on file  . Food insecurity    Worry: Not on file    Inability: Not on file  . Transportation needs    Medical: Not on file    Non-medical: Not on file  Tobacco Use  . Smoking status: Current Every Day Smoker    Packs/day: 0.25    Types: Cigarettes  . Smokeless tobacco: Never Used  Substance and Sexual Activity  . Alcohol use: No  . Drug use: No  . Sexual activity: Yes  Lifestyle  . Physical activity    Days per week: Not on file    Minutes per session: Not on file  . Stress: Not on file  Relationships  . Social Musician on phone: Not on file    Gets together: Not on file    Attends religious service: Not on file    Active member of club or organization: Not on file    Attends meetings of clubs or organizations: Not on file    Relationship status: Not on file  Other Topics Concern  . Not on file  Social History Narrative  . Not on file    Allergies:  Allergies  Allergen Reactions  . Penicillins Anaphylaxis  . Sulfa Antibiotics Anaphylaxis  . Corticosteroids   . Meperidine     Other reaction(s): Unknown  . Prednisone     Metabolic Disorder Labs: No results found for: HGBA1C, MPG No results found for: PROLACTIN No results found for: CHOL, TRIG, HDL, CHOLHDL, VLDL, LDLCALC No results found for: TSH  Therapeutic Level Labs: No results found for: LITHIUM No results found for: VALPROATE No components found for:  CBMZ  Current Medications: Current Outpatient Medications  Medication Sig Dispense Refill  . aspirin EC 81 MG tablet Take 81 mg by mouth daily.    Marland Kitchen atorvastatin (LIPITOR) 40 MG tablet Take 40 mg by mouth daily.    . clopidogrel (PLAVIX)  75 MG tablet Take 75 mg by mouth daily.    . diclofenac (VOLTAREN) 75 MG EC tablet Take 1 tablet (75 mg total) by mouth 2 (two) times daily as needed (pain flare). 30 tablet 2  . gabapentin (NEURONTIN) 600 MG tablet TK 1 T PO BID AND 2 TS AT BEDTIME    . Melatonin 10 MG TABS Take 10 mg by mouth at bedtime.    . meloxicam (MOBIC) 15 MG tablet Take by mouth.    . metoprolol succinate (TOPROL-XL) 100 MG 24 hr tablet Take 100 mg by mouth daily. Take with or immediately following a meal.    . mirtazapine (REMERON) 30 MG tablet  Take 1 tablet (30 mg total) by mouth at bedtime. 90 tablet 0  . montelukast (SINGULAIR) 10 MG tablet Take 10 mg by mouth at bedtime.    . Oxcarbazepine (TRILEPTAL) 300 MG tablet Take 1 tablet (300 mg total) by mouth 2 (two) times daily. 60 tablet 1  . QUEtiapine (SEROQUEL) 300 MG tablet Take 1 tablet (300 mg total) by mouth at bedtime. 90 tablet 0  . sildenafil (REVATIO) 20 MG tablet Take three to five up to once daily as needed for erectile dysfunction.    . temazepam (RESTORIL) 15 MG capsule Take 2 capsules (30 mg total) by mouth at bedtime as needed for sleep. 60 capsule 0  . topiramate (TOPAMAX) 50 MG tablet Take by mouth.    . varenicline (CHANTIX) 0.5 MG tablet Take 1 tablet (0.5 mg total) by mouth 2 (two) times daily. 60 tablet 1  . Vitamin D, Ergocalciferol, (DRISDOL) 50000 units CAPS capsule Take 50,000 Units by mouth every 7 (seven) days.     No current facility-administered medications for this visit.      Musculoskeletal: Strength & Muscle Tone: UTA Gait & Station: Reports as WNL Patient leans: N/A  Psychiatric Specialty Exam: Review of Systems  Psychiatric/Behavioral: The patient has insomnia.        Mood swings  All other systems reviewed and are negative.   There were no vitals taken for this visit.There is no height or weight on file to calculate BMI.  General Appearance: UTA  Eye Contact:  UTA  Speech:  Clear and Coherent  Volume:  Normal  Mood:   mood swings  Affect:  UTA  Thought Process:  Goal Directed and Descriptions of Associations: Intact  Orientation:  Full (Time, Place, and Person)  Thought Content: Logical   Suicidal Thoughts:  No  Homicidal Thoughts:  No  Memory:  Immediate;   Fair Recent;   Fair Remote;   Fair  Judgement:  Fair  Insight:  Fair  Psychomotor Activity:  UTA  Concentration:  Concentration: Fair and Attention Span: Fair  Recall:  [employer] of Knowledge: Fair  Language: Fair  Akathisia:  No  Handed:  Right  AIMS (if indicated): denies tremors, rigidity  Assets:  Communication Skills Desire for Improvement Social Support  ADL's:  Intact  Cognition: WNL  Sleep:  Restless   Screenings: PHQ2-9     [facility] Visit from 06/11/2018 in [facility]  PHQ-2 Total Score  0       Assessment and Plan: Dustine is a 58 year old Caucasian male, engaged, retired, lives in [place], has a history of bipolar disorder, PTSD, panic attacks, chronic pain, multiple surgeries, coronary artery disease was evaluated by phone today.  Patient is biologically predisposed given his multiple health issues.  Patient also with psychosocial stressors of pain.  Patient is currently making progress however continues to struggle with mood lability and sleep issues.  Plan as noted below.  Plan Bipolar disorder-improving Seroquel 300 mg p.o. nightly-reduced dosage Mirtazapine 30 mg p.o. nightly Increase Trileptal to 300 mg p.o. twice daily to address mood swings even though progressing.  Panic attacks-stable Seroquel and mirtazapine as prescribed Patient was referred for CBT-pending  Insomnia-restless Seroquel 300 mg p.o. nightly-reduced dosage Will increase temazepam to 30 mg p.o. nightly-patient reports that his health insurance plan will not approve a single 30 mg dosage and to 50 mg has to be sent to pharmacy.  A prescription was already sent out on 01/21/2019-he will discuss with  pharmacy and call us back. Continue sleep hygiene techniques. I have reviewed [place] controlled substance database  I have reviewed CMP, TSH-done on 01/30/2019-within normal limits.  Patient's lab abnormalities were likely due to lithium and now that he is off of the lithium his creatinine as well as TSH has returned to normal.  Follow-up in clinic in 1 month or sooner if needed.  December 28 at 11 AM  I have spent atleast 15 minutes non face to face with patient today. More than 50 % of the time was spent for psychoeducation and supportive psychotherapy and care coordination. This note was generated in part or whole with voice recognition software. Voice recognition is usually quite accurate but there are transcription errors that can and very often do occur. I apologize for any typographical errors that were not detected and corrected.       Ursula Alert, MD 02/17/2019, 5:02 PM

## 2019-03-04 ENCOUNTER — Other Ambulatory Visit: Payer: Self-pay | Admitting: Psychiatry

## 2019-03-04 DIAGNOSIS — F17200 Nicotine dependence, unspecified, uncomplicated: Secondary | ICD-10-CM

## 2019-03-16 ENCOUNTER — Other Ambulatory Visit: Payer: Self-pay | Admitting: Psychiatry

## 2019-03-16 DIAGNOSIS — F4310 Post-traumatic stress disorder, unspecified: Secondary | ICD-10-CM

## 2019-03-17 ENCOUNTER — Telehealth: Payer: Self-pay | Admitting: Psychiatry

## 2019-03-17 DIAGNOSIS — F4310 Post-traumatic stress disorder, unspecified: Secondary | ICD-10-CM

## 2019-03-17 MED ORDER — TEMAZEPAM 30 MG PO CAPS: 30.0000 mg | ORAL_CAPSULE | Freq: Every evening | ORAL | 0 refills | Status: DC | PRN

## 2019-03-17 NOTE — Telephone Encounter (Signed)
I have sent Temazepam to pharmacy - 30 mg.

## 2019-03-23 ENCOUNTER — Other Ambulatory Visit: Payer: Self-pay | Admitting: Psychiatry

## 2019-03-23 DIAGNOSIS — F4310 Post-traumatic stress disorder, unspecified: Secondary | ICD-10-CM

## 2019-03-23 DIAGNOSIS — F3161 Bipolar disorder, current episode mixed, mild: Secondary | ICD-10-CM

## 2019-03-24 ENCOUNTER — Encounter: Payer: Self-pay | Admitting: Psychiatry

## 2019-03-24 ENCOUNTER — Other Ambulatory Visit: Payer: Self-pay

## 2019-03-24 ENCOUNTER — Ambulatory Visit (INDEPENDENT_AMBULATORY_CARE_PROVIDER_SITE_OTHER): Payer: Medicare HMO | Admitting: Psychiatry

## 2019-03-24 DIAGNOSIS — F4310 Post-traumatic stress disorder, unspecified: Secondary | ICD-10-CM

## 2019-03-24 DIAGNOSIS — F410 Panic disorder [episodic paroxysmal anxiety] without agoraphobia: Secondary | ICD-10-CM | POA: Diagnosis not present

## 2019-03-24 DIAGNOSIS — F3161 Bipolar disorder, current episode mixed, mild: Secondary | ICD-10-CM | POA: Diagnosis not present

## 2019-03-24 DIAGNOSIS — F17200 Nicotine dependence, unspecified, uncomplicated: Secondary | ICD-10-CM | POA: Insufficient documentation

## 2019-03-24 DIAGNOSIS — Z634 Disappearance and death of family member: Secondary | ICD-10-CM | POA: Insufficient documentation

## 2019-03-24 NOTE — Progress Notes (Signed)
Virtual Visit via Telephone Note  I connected with James Bryan on 03/24/19 at 11:00 AM EST by telephone and verified that I am speaking with the correct person using two identifiers.   I discussed the limitations, risks, security and privacy concerns of performing an evaluation and management service by telephone and the availability of in person appointments. I also discussed with the patient that there may be a patient responsible charge related to this service. The patient expressed understanding and agreed to proceed.      I discussed the assessment and treatment plan with the patient. The patient was provided an opportunity to ask questions and all were answered. The patient agreed with the plan and demonstrated an understanding of the instructions.   The patient was advised to call back or seek an in-person evaluation if the symptoms worsen or if the condition fails to improve as anticipated.   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  Patient preferred to do a phone call.  Patient today reports he is currently depressed.  His dad passed away this morning.  He was found dead in his yard this morning.  Patient became tearful when he discussed this.  He reports he is currently on his way to his parents home.  Patient reports he feels overwhelmed and would like to have a medication that he can use as needed.  Patient requested Ativan.  However patient is already on temazepam which he takes for sleep.  That is the only medication that has worked for him for his sleep.  Discussed with patient that since he needs temazepam for his sleep  Ativan may not be a good choice since it is in the same class.  Discussed adding Seroquel low dosage as needed.  Patient agrees with plan.  Patient advised to start counseling-bereavement counseling with hospice.  Provided him information for the same.  Patient currently reports he has good support system from his partner.  He denies any suicidality, homicidality or perceptual disturbances.  Patient denies any other concerns today. Visit Diagnosis:    ICD-10-CM   1. Bipolar 1 disorder, mixed, mild (HCC)  F31.61   2. PTSD (post-traumatic stress disorder)  F43.10   3. Panic attacks  F41.0   4. Tobacco use disorder  F17.200   5. Bereavement  Z63.4     Past Psychiatric History: I have reviewed past psychiatric history from my progress note on 07/19/2018.  Past trials of Seroquel, trazodone, Ativan, mirtazapine, gabapentin.  Past Medical History:  Past Medical History:  Diagnosis Date  . Anxiety   . Coronary artery disease   . Depression   . Hyperlipidemia   . Hypertension   . Myocardial infarction ([place])   . PTSD (post-traumatic stress disorder)     Past Surgical History:  Procedure Laterality Date  . APPENDECTOMY    . JOINT REPLACEMENT Right    knee  . LUMBAR LAMINECTOMY  2012  . thumb surgery     reimplantation    Family Psychiatric History: I have reviewed family psychiatric history from my progress note on 07/19/2018.  Family History:  Family History  Problem Relation Age of Onset  . Diabetes Mother   . Depression Mother   . Alzheimer's disease Mother   . Diabetes Father   . Glaucoma Father     Social History: Reviewed social history from my progress note on 07/19/2018. Social History   Socioeconomic History  . Marital status: Single    Spouse name: Not on file  . Number of children: Not on file  . Years of education: Not on file  . Highest education level: Not on file  Occupational History  . Not on file  Tobacco Use  . Smoking status: Current Every Day  Smoker    Packs/day: 0.25    Types: Cigarettes  . Smokeless tobacco: Never Used  Substance and Sexual Activity  . Alcohol use: No  . Drug use: No  . Sexual activity: Yes  Other Topics Concern  . Not on file  Social History Narrative  . Not on file   Social Determinants of Health   Financial Resource Strain:   . Difficulty of Paying Living Expenses: Not on file  Food Insecurity:   . Worried About Programme researcher, broadcasting/film/videounning Out of Food in the Last Year: Not on file  . Ran Out of Food in the Last Year: Not on file  Transportation Needs:   . Lack of Transportation (Medical): Not on file  . Lack of Transportation (Non-Medical): Not on file  Physical Activity:   . Days of Exercise per Week: Not on file  . Minutes of Exercise per Session: Not on file  Stress:   . Feeling of Stress : Not on file  Social Connections:   . Frequency of Communication with Friends and Family: Not on file  . Frequency of Social Gatherings with Friends and Family: Not on file  . Attends Religious Services: Not on file  . Active Member of Clubs or Organizations: Not on file  . Attends BankerClub or Organization Meetings: Not on file  . Marital Status: Not on file    Allergies:  Allergies  Allergen Reactions  . Penicillins Anaphylaxis  . Sulfa Antibiotics Anaphylaxis  . Corticosteroids   . Meperidine     Other reaction(s): Unknown  . Prednisone     Metabolic Disorder Labs: No results found for: HGBA1C, MPG No results found for: PROLACTIN No results found for: CHOL, TRIG, HDL, CHOLHDL, VLDL, LDLCALC No results found for: TSH  Therapeutic Level Labs: No results found for: LITHIUM No results found for: VALPROATE No components found for:  CBMZ  Current Medications: Current Outpatient Medications  Medication Sig Dispense Refill  . aspirin EC 81 MG tablet Take 81 mg by mouth daily.    Marland Kitchen. atorvastatin (LIPITOR) 40 MG tablet Take 40 mg by mouth daily.    . CHANTIX 0.5 MG tablet TAKE 1 TABLET BY MOUTH 2 TIMES DAILY. 180  tablet 1  . clopidogrel (PLAVIX) 75 MG tablet Take 75 mg by mouth daily.    . diclofenac (VOLTAREN) 75 MG EC tablet Take 1 tablet (75 mg total) by mouth 2 (two) times daily as needed (pain flare). 30 tablet 2  . gabapentin (NEURONTIN) 600 MG tablet TK 1 T PO BID AND 2 TS AT BEDTIME    . Melatonin 10 MG TABS Take 10 mg by mouth at bedtime.    . meloxicam (MOBIC) 15 MG tablet Take by mouth.    . metoprolol succinate (TOPROL-XL) 100 MG 24 hr tablet Take 100 mg by mouth daily. Take with or immediately following a meal.    .  mirtazapine (REMERON) 30 MG tablet Take 1 tablet (30 mg total) by mouth at bedtime. 90 tablet 0  . montelukast (SINGULAIR) 10 MG tablet Take 10 mg by mouth at bedtime.    . Oxcarbazepine (TRILEPTAL) 300 MG tablet TAKE 1 TABLET BY MOUTH TWICE A DAY 60 tablet 1  . QUEtiapine (SEROQUEL) 300 MG tablet Take 1 tablet (300 mg total) by mouth at bedtime. 90 tablet 0  . sildenafil (REVATIO) 20 MG tablet Take three to five up to once daily as needed for erectile dysfunction.    . temazepam (RESTORIL) 30 MG capsule Take 1 capsule (30 mg total) by mouth at bedtime as needed for sleep. 30 capsule 0  . topiramate (TOPAMAX) 50 MG tablet Take by mouth.    . Vitamin D, Ergocalciferol, (DRISDOL) 50000 units CAPS capsule Take 50,000 Units by mouth every 7 (seven) days.     No current facility-administered medications for this visit.     Musculoskeletal: Strength & Muscle Tone: UTA Gait & Station: Reports as WNL Patient leans: N/A  Psychiatric Specialty Exam: Review of Systems  Psychiatric/Behavioral: Positive for dysphoric mood.  All other systems reviewed and are negative.   There were no vitals taken for this visit.There is no height or weight on file to calculate BMI.  General Appearance: UTA  Eye Contact:  UTA  Speech:  Clear and Coherent  Volume:  Normal  Mood:  Depressed  Affect:  UTA   Thought Process:  Goal Directed and Descriptions of Associations: Intact  Orientation:   Full (Time, Place, and Person)  Thought Content: Logical   Suicidal Thoughts:  No  Homicidal Thoughts:  No  Memory:  Immediate;   Fair Recent;   Fair Remote;   Fair  Judgement:  Fair  Insight:  Fair  Psychomotor Activity:  Normal  Concentration:  Concentration: Fair and Attention Span: Fair  Recall:  [employer] of Knowledge: Fair  Language: Fair  Akathisia:  No  Handed:  Right  AIMS (if indicated):Denies tremors, rigidity  Assets:  Communication Skills Desire for Improvement Housing Intimacy Talents/Skills Transportation  ADL's:  Intact  Cognition: WNL  Sleep:  Fair   Screenings: PHQ2-9     [facility] Visit from 06/11/2018 in [facility]  PHQ-2 Total Score  0       Assessment and Plan: Andrej is a 58 year old Caucasian male, engaged, retired, lives in [place], has a history of bipolar disorder, PTSD, panic attacks, chronic pain, multiple surgeries, coronary artery disease was evaluated by phone today.  Patient is biologically predisposed given his multiple health issues.  Patient also with psychosocial stressors of pain and the death of his father this morning.  Patient will benefit from psychotherapy sessions as well as medication management.  Plan as noted below.  Plan Bipolar disorder-improving Seroquel 300 mg p.o. nightly-reduced dosage Mirtazapine 30 mg p.o. nightly Trileptal 300 mg p.o. twice daily.  Panic attacks-stable Seroquel and mirtazapine as prescribed Patient referred for CBT-pending  Insomnia-improving Seroquel 300 mg p.o. nightly Temazepam 30 mg p.o. nightly Continue sleep hygiene techniques  Bereavement-unstable Patient provided with information for bereavement counseling-[email or web address]com/, 800 [id number] Restart Seroquel 25 mg p.o. daily as needed for severe anxiety.  Follow-up in clinic in 3 weeks or sooner if needed.  January 19 at 11 AM  I have spent atleast 15 minutes non face to face with  patient today. More than 50 % of the time was spent for psychoeducation and supportive psychotherapy and care coordination. This  note was generated in part or whole with voice recognition software. Voice recognition is usually quite accurate but there are transcription errors that can and very often do occur. I apologize for any typographical errors that were not detected and corrected.       Jomarie Longs, MD 03/24/2019, 11:43 AM

## 2019-04-15 ENCOUNTER — Other Ambulatory Visit: Payer: Self-pay

## 2019-04-15 ENCOUNTER — Ambulatory Visit (INDEPENDENT_AMBULATORY_CARE_PROVIDER_SITE_OTHER): Payer: Medicare HMO | Admitting: Psychiatry

## 2019-04-15 ENCOUNTER — Encounter: Payer: Self-pay | Admitting: Psychiatry

## 2019-04-15 DIAGNOSIS — F3161 Bipolar disorder, current episode mixed, mild: Secondary | ICD-10-CM | POA: Diagnosis not present

## 2019-04-15 DIAGNOSIS — Z634 Disappearance and death of family member: Secondary | ICD-10-CM

## 2019-04-15 DIAGNOSIS — F410 Panic disorder [episodic paroxysmal anxiety] without agoraphobia: Secondary | ICD-10-CM

## 2019-04-15 DIAGNOSIS — F17200 Nicotine dependence, unspecified, uncomplicated: Secondary | ICD-10-CM | POA: Diagnosis not present

## 2019-04-15 DIAGNOSIS — F4310 Post-traumatic stress disorder, unspecified: Secondary | ICD-10-CM

## 2019-04-15 MED ORDER — QUETIAPINE FUMARATE 300 MG PO TABS: 300.0000 mg | ORAL_TABLET | Freq: Every day | ORAL | 0 refills | Status: DC

## 2019-04-15 MED ORDER — OXCARBAZEPINE 600 MG PO TABS: 300.0000 mg | ORAL_TABLET | ORAL | 1 refills | Status: DC

## 2019-04-15 MED ORDER — TEMAZEPAM 30 MG PO CAPS: 30.0000 mg | ORAL_CAPSULE | Freq: Every evening | ORAL | 2 refills | Status: DC | PRN

## 2019-04-15 MED ORDER — MIRTAZAPINE 30 MG PO TABS: 30.0000 mg | ORAL_TABLET | Freq: Every day | ORAL | 0 refills | Status: DC

## 2019-04-15 NOTE — Progress Notes (Signed)
Provider Location : [facility] Patient Location : Office   Virtual Visit via Telephone Note  I connected with James Bryan on 04/15/19 at 11:00 AM EST by telephone and verified that I am speaking with the correct person using two identifiers.   I discussed the limitations, risks, security and privacy concerns of performing an evaluation and management service by telephone and the availability of in person appointments. I also discussed with the patient that there may be a patient responsible charge related to this service. The patient expressed understanding and agreed to proceed.      I discussed the assessment and treatment plan with the patient. The patient was provided an opportunity to ask questions and all were answered. The patient agreed with the plan and demonstrated an understanding of the instructions.   The patient was advised to call back or seek an in-person evaluation if the symptoms worsen or if the condition fails to improve as anticipated.   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  Patient preferred to do a phone call.  Patient today reports he is currently trying to cope with the loss of his father.  He reports his mother who was dependent on his father needs a lot of help at this time.  Patient reports he is the only person who can offer that to her since his sister may not be capable of doing it.  Patient reports his mother struggles with dementia and needs a lot of support.  That is challenging since he has to currently come up with long-term plan for her.  Patient reports he currently works and his boss has been  very supportive.  He reports he recently had some trouble with his mood, is often observed as being zoned out, ruminating at work and so on.  Patient reports the Trileptal does help however he is interested in a dosage increase.  Patient reports sleep is good on the current medication regimen.  Patient denies any suicidality, homicidality or perceptual disturbances.  He reports he is waiting a call back from a class for smoking cessation.  Patient denies any other concerns today. Visit Diagnosis:    ICD-10-CM   1. Bipolar 1 disorder, mixed, mild (HCC)  F31.61 mirtazapine (REMERON) 30 MG tablet    QUEtiapine (SEROQUEL) 300 MG tablet    oxcarbazepine (TRILEPTAL) 600 MG tablet  2. PTSD (post-traumatic stress disorder)  F43.10 temazepam (RESTORIL) 30 MG capsule  3. Panic attacks  F41.0   4. Tobacco use disorder  F17.200   5. Bereavement  Z63.4     Past Psychiatric History: I have reviewed past psychiatric history from my progress note on 07/19/2018.  Past trials of Seroquel, trazodone, Ativan, mirtazapine, gabapentin.  Past Medical History:  Past Medical History:  Diagnosis Date  . Anxiety   . Coronary artery disease   . Depression   . Hyperlipidemia   . Hypertension   . Myocardial infarction (HCC)   . PTSD (post-traumatic stress disorder)     Past Surgical History:  Procedure Laterality Date  . APPENDECTOMY    . JOINT REPLACEMENT Right    knee  .  LUMBAR LAMINECTOMY  2012  . thumb surgery     reimplantation    Family Psychiatric History: I have reviewed family psychiatric history from my progress note on 07/19/2018.  Family History:  Family History  Problem Relation Age of Onset  . Diabetes Mother   . Depression Mother   . Alzheimer's disease Mother   . Diabetes Father   . Glaucoma Father     Social History: Reviewed social history from my progress note on 07/19/2018. Social History   Socioeconomic History  . Marital status: Single    Spouse name: Not on file  .  Number of children: Not on file  . Years of education: Not on file  . Highest education level: Not on file  Occupational History  . Not on file  Tobacco Use  . Smoking status: Current Every Day Smoker    Packs/day: 0.25    Types: Cigarettes  . Smokeless tobacco: Never Used  Substance and Sexual Activity  . Alcohol use: No  . Drug use: No  . Sexual activity: Yes  Other Topics Concern  . Not on file  Social History Narrative  . Not on file   Social Determinants of Health   Financial Resource Strain:   . Difficulty of Paying Living Expenses: Not on file  Food Insecurity:   . Worried About Charity fundraiser in the Last Year: Not on file  . Ran Out of Food in the Last Year: Not on file  Transportation Needs:   . Lack of Transportation (Medical): Not on file  . Lack of Transportation (Non-Medical): Not on file  Physical Activity:   . Days of Exercise per Week: Not on file  . Minutes of Exercise per Session: Not on file  Stress:   . Feeling of Stress : Not on file  Social Connections:   . Frequency of Communication with Friends and Family: Not on file  . Frequency of Social Gatherings with Friends and Family: Not on file  . Attends Religious Services: Not on file  . Active Member of Clubs or Organizations: Not on file  . Attends Archivist Meetings: Not on file  . Marital Status: Not on file    Allergies:  Allergies  Allergen Reactions  . Penicillins Anaphylaxis  . Sulfa Antibiotics Anaphylaxis  . Corticosteroids   . Meperidine     Other reaction(s): Unknown  . Prednisone     Metabolic Disorder Labs: No results found for: HGBA1C, MPG No results found for: PROLACTIN No results found for: CHOL, TRIG, HDL, CHOLHDL, VLDL, LDLCALC No results found for: TSH  Therapeutic Level Labs: No results found for: LITHIUM No results found for: VALPROATE No components found for:  CBMZ  Current Medications: Current Outpatient Medications  Medication Sig Dispense  Refill  . aspirin EC 81 MG tablet Take 81 mg by mouth daily.    Marland Kitchen atorvastatin (LIPITOR) 40 MG tablet Take 40 mg by mouth daily.    . CHANTIX 0.5 MG tablet TAKE 1 TABLET BY MOUTH 2 TIMES DAILY. 180 tablet 1  . clopidogrel (PLAVIX) 75 MG tablet Take 75 mg by mouth daily.    . diclofenac (VOLTAREN) 75 MG EC tablet Take 1 tablet (75 mg total) by mouth 2 (two) times daily as needed (pain flare). 30 tablet 2  . gabapentin (NEURONTIN) 600 MG tablet TK 1 T PO BID AND 2 TS AT BEDTIME    . Melatonin 10 MG TABS Take 10 mg by mouth at bedtime.    Marland Kitchen  meloxicam (MOBIC) 15 MG tablet Take by mouth.    . metoprolol succinate (TOPROL-XL) 100 MG 24 hr tablet Take 100 mg by mouth daily. Take with or immediately following a meal.    . mirtazapine (REMERON) 30 MG tablet Take 1 tablet (30 mg total) by mouth at bedtime. 90 tablet 0  . montelukast (SINGULAIR) 10 MG tablet Take 10 mg by mouth at bedtime.    Marland Kitchen oxcarbazepine (TRILEPTAL) 600 MG tablet Take 0.5-1.5 tablets (300-900 mg total) by mouth as directed. TAKE HALF TABLET (300 MG ) AM AND ONE AND HALF TABLET ( 900 MG ) PM 60 tablet 1  . QUEtiapine (SEROQUEL) 300 MG tablet Take 1 tablet (300 mg total) by mouth at bedtime. 90 tablet 0  . sildenafil (REVATIO) 20 MG tablet Take three to five up to once daily as needed for erectile dysfunction.    . temazepam (RESTORIL) 30 MG capsule Take 1 capsule (30 mg total) by mouth at bedtime as needed for sleep. 30 capsule 2  . topiramate (TOPAMAX) 50 MG tablet Take by mouth.    . Vitamin D, Ergocalciferol, (DRISDOL) 50000 units CAPS capsule Take 50,000 Units by mouth every 7 (seven) days.     No current facility-administered medications for this visit.     Musculoskeletal: Strength & Muscle Tone: UTA Gait & Station: Reports as WNL Patient leans: N/A  Psychiatric Specialty Exam: Review of Systems  Psychiatric/Behavioral: Positive for dysphoric mood. The patient is nervous/anxious.   All other systems reviewed and are  negative.   There were no vitals taken for this visit.There is no height or weight on file to calculate BMI.  General Appearance: UTA  Eye Contact:  UTA  Speech:  Clear and Coherent  Volume:  Normal  Mood:  Anxious and Dysphoric  Affect:  UTA  Thought Process:  Goal Directed and Descriptions of Associations: Intact  Orientation:  Full (Time, Place, and Person)  Thought Content: Logical   Suicidal Thoughts:  No  Homicidal Thoughts:  No  Memory:  Immediate;   Fair Recent;   Fair Remote;   Fair  Judgement:  Fair  Insight:  Fair  Psychomotor Activity:  UTA  Concentration:  Concentration: Fair and Attention Span: Fair  Recall:  [employer] of Knowledge: Fair  Language: Fair  Akathisia:  No  Handed:  Right  AIMS (if indicated): Denies tremors, rigidity  Assets:  Communication Skills Desire for Improvement Housing Social Support  ADL's:  Intact  Cognition: WNL  Sleep:  Fair   Screenings: PHQ2-9     [facility] Visit from 06/11/2018 in [facility]  PHQ-2 Total Score  0       Assessment and Plan: James Bryan is a 59 year old Caucasian male, engaged, retired, lives in [place], has a history of bipolar disorder, PTSD, panic attacks, chronic pain, multiple surgeries, coronary artery disease was evaluated by phone today.  Patient is biologically predisposed given his multiple health issues.  Patient also with psychosocial stressors of pain , death of his father, being the primary caretaker of his mother.  Patient will continue to benefit from medication readjustment since he struggles with mood symptoms.  Plan as noted below.  Plan Bipolar disorder-improving Increase Trileptal to 300 mg p.o. daily and 900 mg p.o. every afternoon Seroquel 300 mg p.o. nightly-reduced dosage Mirtazapine 30 mg p.o. nightly  Panic attacks-stable Seroquel and mirtazapine as prescribed Patient referred for CBT-pending  Insomnia-improving Seroquel 300 mg p.o.  nightly Temazepam 30 mg p.o.  nightly Discussed with patient that drug to drug interaction between Seroquel, temazepam, Remeron.  Bereavement-improving Patient was provided information for grief counseling with hospice.  Tobbacco use disorder- unstable Provided counseling.  Follow-up in clinic in 4 weeks or sooner if needed.  February 18 at 3:30 PM  I have spent atleast 20 minutes non face to face with patient today. More than 50 % of the time was spent for ordering medications and test ,psychoeducation and supportive psychotherapy and care coordination,as well as documenting clinical information in electronic health record. This note was generated in part or whole with voice recognition software. Voice recognition is usually quite accurate but there are transcription errors that can and very often do occur. I apologize for any typographical errors that were not detected and corrected.       Jomarie Longs, MD 04/15/2019, 12:58 PM

## 2019-04-24 ENCOUNTER — Other Ambulatory Visit: Payer: Self-pay | Admitting: Psychiatry

## 2019-04-24 DIAGNOSIS — F4310 Post-traumatic stress disorder, unspecified: Secondary | ICD-10-CM

## 2019-04-24 DIAGNOSIS — F3161 Bipolar disorder, current episode mixed, mild: Secondary | ICD-10-CM

## 2019-04-25 DIAGNOSIS — L719 Rosacea, unspecified: Secondary | ICD-10-CM | POA: Insufficient documentation

## 2019-04-28 ENCOUNTER — Other Ambulatory Visit: Payer: Self-pay | Admitting: Psychiatry

## 2019-04-28 DIAGNOSIS — F3161 Bipolar disorder, current episode mixed, mild: Secondary | ICD-10-CM

## 2019-05-15 ENCOUNTER — Ambulatory Visit: Payer: Medicare HMO | Admitting: Psychiatry

## 2019-06-04 ENCOUNTER — Other Ambulatory Visit: Payer: Self-pay

## 2019-06-04 ENCOUNTER — Ambulatory Visit: Payer: Medicare HMO | Admitting: Psychiatry

## 2019-06-24 ENCOUNTER — Encounter: Payer: Self-pay | Admitting: Psychiatry

## 2019-06-24 ENCOUNTER — Ambulatory Visit (INDEPENDENT_AMBULATORY_CARE_PROVIDER_SITE_OTHER): Payer: Medicare Other | Admitting: Psychiatry

## 2019-06-24 ENCOUNTER — Other Ambulatory Visit: Payer: Self-pay

## 2019-06-24 DIAGNOSIS — F3161 Bipolar disorder, current episode mixed, mild: Secondary | ICD-10-CM | POA: Diagnosis not present

## 2019-06-24 DIAGNOSIS — Z634 Disappearance and death of family member: Secondary | ICD-10-CM | POA: Diagnosis not present

## 2019-06-24 DIAGNOSIS — F4310 Post-traumatic stress disorder, unspecified: Secondary | ICD-10-CM | POA: Diagnosis not present

## 2019-06-24 DIAGNOSIS — F17200 Nicotine dependence, unspecified, uncomplicated: Secondary | ICD-10-CM

## 2019-06-24 DIAGNOSIS — F410 Panic disorder [episodic paroxysmal anxiety] without agoraphobia: Secondary | ICD-10-CM | POA: Diagnosis not present

## 2019-06-24 MED ORDER — HYDROXYZINE PAMOATE 25 MG PO CAPS: 25.0000 mg | ORAL_CAPSULE | Freq: Two times a day (BID) | ORAL | 1 refills | Status: DC | PRN

## 2019-06-24 MED ORDER — OLANZAPINE 5 MG PO TABS: 5.0000 mg | ORAL_TABLET | Freq: Every day | ORAL | 1 refills | Status: DC

## 2019-06-24 NOTE — Progress Notes (Signed)
Provider Location : [facility] Patient Location : Advertising account executive Visit via Telephone Note  I connected with James Bryan on 06/24/19 at  2:40 PM EDT by telephone and verified that I am speaking with the correct person using two identifiers.   I discussed the limitations, risks, security and privacy concerns of performing an evaluation and management service by telephone and the availability of in person appointments. I also discussed with the patient that there may be a patient responsible charge related to this service. The patient expressed understanding and agreed to proceed.      I discussed the assessment and treatment plan with the patient. The patient was provided an opportunity to ask questions and all were answered. The patient agreed with the plan and demonstrated an understanding of the instructions.   The patient was advised to call back or seek an in-person evaluation if the symptoms worsen or if the condition fails to improve as anticipated.   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  Patient preferred to do a phone call.  Patient today reports he lost his daughter.  He reports she passed away last night from an accidental drug overdose.  He reports he is currently grieving her loss.  He reports he was very close to her.  He does have support system from his friend as well as family.  Patient reports he is currently struggling with sadness, mood swings, sleep issues.  The Seroquel or the temazepam does not help him to sleep anymore.  He however wants to stay on the temazepam.  Patient denies any suicidality, homicidality or  perceptual disturbances.  He is not interested in psychotherapy sessions at this time.  He reports he does not think he can find the time with his job.  Patient however would like his medications to be readjusted.  Discussed starting Zyprexa and stopping Seroquel.  He agrees with plan. Visit Diagnosis:    ICD-10-CM   1. Bipolar 1 disorder, mixed, mild (HCC)  F31.61 OLANZapine (ZYPREXA) 5 MG tablet  2. PTSD (post-traumatic stress disorder)  F43.10 OLANZapine (ZYPREXA) 5 MG tablet  3. Bereavement  Z63.4 hydrOXYzine (VISTARIL) 25 MG capsule  4. Panic attacks  F41.0 hydrOXYzine (VISTARIL) 25 MG capsule  5. Tobacco use disorder  F17.200     Past Psychiatric History: I have reviewed past psychiatric history from my progress note on 07/19/2018.  Past trials of Seroquel, trazodone, Ativan, mirtazapine, gabapentin  Past Medical History:  Past Medical History:  Diagnosis Date  . Anxiety   . Coronary artery disease   . Depression   . Hyperlipidemia   . Hypertension   . Myocardial infarction (HCC)   . PTSD (post-traumatic stress disorder)     Past Surgical History:  Procedure Laterality Date  . APPENDECTOMY    . JOINT REPLACEMENT Right    knee  . LUMBAR LAMINECTOMY  2012  . thumb surgery     reimplantation    Family Psychiatric History: I have reviewed family psychiatric history from my progress note on 07/19/2018.  Family History:  Family History  Problem Relation Age of Onset  . Diabetes Mother   .  Depression Mother   . Alzheimer's disease Mother   . Diabetes Father   . Glaucoma Father     Social History: I have reviewed social history from my progress note on 07/19/2018. Social History   Socioeconomic History  . Marital status: Single    Spouse name: Not on file  . Number of children: Not on file  . Years of education: Not on file  . Highest education level: Not on file  Occupational History  . Not on file  Tobacco Use  . Smoking status: Current Every Day Smoker     Packs/day: 0.25    Types: Cigarettes  . Smokeless tobacco: Never Used  Substance and Sexual Activity  . Alcohol use: No  . Drug use: No  . Sexual activity: Yes  Other Topics Concern  . Not on file  Social History Narrative  . Not on file   Social Determinants of Health   Financial Resource Strain:   . Difficulty of Paying Living Expenses:   Food Insecurity:   . Worried About Programme researcher, broadcasting/film/video in the Last Year:   . Barista in the Last Year:   Transportation Needs:   . Freight forwarder (Medical):   Marland Kitchen Lack of Transportation (Non-Medical):   Physical Activity:   . Days of Exercise per Week:   . Minutes of Exercise per Session:   Stress:   . Feeling of Stress :   Social Connections:   . Frequency of Communication with Friends and Family:   . Frequency of Social Gatherings with Friends and Family:   . Attends Religious Services:   . Active Member of Clubs or Organizations:   . Attends Banker Meetings:   Marland Kitchen Marital Status:     Allergies:  Allergies  Allergen Reactions  . Penicillins Anaphylaxis  . Sulfa Antibiotics Anaphylaxis  . Corticosteroids   . Meperidine     Other reaction(s): Unknown  . Prednisone     Metabolic Disorder Labs: No results found for: HGBA1C, MPG No results found for: PROLACTIN No results found for: CHOL, TRIG, HDL, CHOLHDL, VLDL, LDLCALC No results found for: TSH  Therapeutic Level Labs: No results found for: LITHIUM No results found for: VALPROATE No components found for:  CBMZ  Current Medications: Current Outpatient Medications  Medication Sig Dispense Refill  . butalbital-acetaminophen-caffeine (FIORICET) 50-325-40 MG tablet Take by mouth.    . metroNIDAZOLE (METROGEL) 0.75 % gel Apply topically.    . tamsulosin (FLOMAX) 0.4 MG CAPS capsule Take by mouth.    . traMADol (ULTRAM) 50 MG tablet Take by mouth.    Marland Kitchen aspirin EC 81 MG tablet Take 81 mg by mouth daily.    Marland Kitchen atorvastatin (LIPITOR) 40 MG tablet Take  40 mg by mouth daily.    . Butalbital-APAP-Caffeine 50-325-40 MG capsule Take 2 capsules by mouth every 8 (eight) hours as needed.    . CHANTIX 0.5 MG tablet TAKE 1 TABLET BY MOUTH 2 TIMES DAILY. 180 tablet 1  . clopidogrel (PLAVIX) 75 MG tablet Take 75 mg by mouth daily.    . diclofenac (VOLTAREN) 75 MG EC tablet Take 1 tablet (75 mg total) by mouth 2 (two) times daily as needed (pain flare). 30 tablet 2  . gabapentin (NEURONTIN) 600 MG tablet TK 1 T PO BID AND 2 TS AT BEDTIME    . hydrOXYzine (VISTARIL) 25 MG capsule Take 1-2 capsules (25-50 mg total) by mouth 2 (two) times daily as needed. Severe anxiety and  agitation 120 capsule 1  . Melatonin 10 MG TABS Take 10 mg by mouth at bedtime.    . meloxicam (MOBIC) 15 MG tablet Take by mouth.    . metoprolol succinate (TOPROL-XL) 100 MG 24 hr tablet Take 100 mg by mouth daily. Take with or immediately following a meal.    . metroNIDAZOLE (METROGEL) 0.75 % gel APPLY TO AFFECTED AREA TWICE A DAY    . mirtazapine (REMERON) 30 MG tablet Take 1 tablet (30 mg total) by mouth at bedtime. 90 tablet 0  . montelukast (SINGULAIR) 10 MG tablet Take 10 mg by mouth at bedtime.    Marland Kitchen OLANZapine (ZYPREXA) 5 MG tablet Take 1 tablet (5 mg total) by mouth at bedtime. 30 tablet 1  . oxcarbazepine (TRILEPTAL) 600 MG tablet TAKE 1/2 TABLET (300 MG ) EVERY MORNING AND 1 & 1/2 TABLETS ( 900 MG ) IN THE EVENING 180 tablet 1  . sildenafil (REVATIO) 20 MG tablet Take three to five up to once daily as needed for erectile dysfunction.    . temazepam (RESTORIL) 30 MG capsule Take 1 capsule (30 mg total) by mouth at bedtime as needed for sleep. 30 capsule 2  . topiramate (TOPAMAX) 50 MG tablet Take by mouth.    . Vitamin D, Ergocalciferol, (DRISDOL) 50000 units CAPS capsule Take 50,000 Units by mouth every 7 (seven) days.     No current facility-administered medications for this visit.     Musculoskeletal: Strength & Muscle Tone: UTA Gait & Station: UTA Patient leans:  N/A  Psychiatric Specialty Exam: Review of Systems  Psychiatric/Behavioral: Positive for dysphoric mood and sleep disturbance. The patient is nervous/anxious.   All other systems reviewed and are negative.   There were no vitals taken for this visit.There is no height or weight on file to calculate BMI.  General Appearance: UTA  Eye Contact:  UTA  Speech:  Clear and Coherent  Volume:  Normal  Mood:  Anxious and Depressed  Affect:  UTA  Thought Process:  Goal Directed and Descriptions of Associations: Intact  Orientation:  Full (Time, Place, and Person)  Thought Content: Rumination   Suicidal Thoughts:  No  Homicidal Thoughts:  No  Memory:  Immediate;   Fair Recent;   Fair Remote;   Fair  Judgement:  Fair  Insight:  Fair  Psychomotor Activity:  UTA  Concentration:  Concentration: Fair and Attention Span: Fair  Recall:  [employer] of Knowledge: Fair  Language: Fair  Akathisia:  No  Handed:  Right  AIMS (if indicated): UTA  Assets:  Communication Skills Desire for Improvement Housing Social Support  ADL's:  Intact  Cognition: WNL  Sleep:  Poor   Screenings: PHQ2-9     [facility] Visit from 06/11/2018 in [facility]  PHQ-2 Total Score  0       Assessment and Plan: James Bryan is a 59 year old Caucasian male, engaged, retired, lives in [place], has a history of bipolar disorder, PTSD, panic attacks, chronic pain, multiple surgeries, coronary artery disease was evaluated by phone today.  Patient is biologically predisposed given his multiple health issues.  Patient with psychosocial stressors of pain, death of his father as well as death of his daughter last night.  Patient will continue to benefit from medication readjustment and possible referral for psychotherapy sessions.  Plan as noted below.  Plan Bipolar disorder-unstable Trileptal 300 mg p.o. daily and 900 mg every afternoon Discontinue Seroquel for lack of benefit Start Zyprexa  5 mg p.o. nightly Mirtazapine 30 mg p.o. nightly  Panic attacks-unstable Patient is currently grieving Start hydroxyzine 25 to 50 mg p.o. twice daily as needed for panic symptoms Discussed CBT referral-patient declined  Insomnia-restless Continue temazepam as prescribed Start Zyprexa 5 mg p.o. nightly. Discussed with patient drug to drug interaction between Zyprexa, temazepam and tramadol.  He will monitor himself.  Bereavement-unstable Provided supportive counseling.  Discussed referral to therapist-he declined  Follow-up in clinic on April 5, patient has upcoming appointment scheduled.  Crisis plan discussed with patient.  I have spent atleast 20 minutes non face to face with patient today. More than 50 % of the time was spent for preparing to see the patient ( e.g., review of test, records ),  ordering medications and test ,psychoeducation and supportive psychotherapy and care coordination,as well as documenting clinical information in electronic health record. This note was generated in part or whole with voice recognition software. Voice recognition is usually quite accurate but there are transcription errors that can and very often do occur. I apologize for any typographical errors that were not detected and corrected.       Ursula Alert, MD 06/24/2019, 5:50 PM

## 2019-06-30 ENCOUNTER — Encounter: Payer: Self-pay | Admitting: Psychiatry

## 2019-06-30 ENCOUNTER — Ambulatory Visit (INDEPENDENT_AMBULATORY_CARE_PROVIDER_SITE_OTHER): Payer: Medicare Other | Admitting: Psychiatry

## 2019-06-30 ENCOUNTER — Other Ambulatory Visit: Payer: Self-pay

## 2019-06-30 DIAGNOSIS — F17200 Nicotine dependence, unspecified, uncomplicated: Secondary | ICD-10-CM

## 2019-06-30 DIAGNOSIS — F410 Panic disorder [episodic paroxysmal anxiety] without agoraphobia: Secondary | ICD-10-CM

## 2019-06-30 DIAGNOSIS — Z634 Disappearance and death of family member: Secondary | ICD-10-CM

## 2019-06-30 DIAGNOSIS — F3161 Bipolar disorder, current episode mixed, mild: Secondary | ICD-10-CM

## 2019-06-30 DIAGNOSIS — F4310 Post-traumatic stress disorder, unspecified: Secondary | ICD-10-CM | POA: Diagnosis not present

## 2019-06-30 MED ORDER — PRAZOSIN HCL 1 MG PO CAPS: 1.0000 mg | ORAL_CAPSULE | Freq: Every day | ORAL | 1 refills | Status: DC

## 2019-06-30 MED ORDER — OXCARBAZEPINE 600 MG PO TABS: ORAL_TABLET | ORAL | 1 refills | Status: DC

## 2019-06-30 NOTE — Progress Notes (Signed)
Provider Location : [facility] Patient Location : Home  Virtual Visit via Telephone Note  I connected with James Bryan on 06/30/19 at  9:30 AM EDT by telephone and verified that I am speaking with the correct person using two identifiers.   I discussed the limitations, risks, security and privacy concerns of performing an evaluation and management service by telephone and the availability of in person appointments. I also discussed with the patient that there may be a patient responsible charge related to this service. The patient expressed understanding and agreed to proceed.     I discussed the assessment and treatment plan with the patient. The patient was provided an opportunity to ask questions and all were answered. The patient agreed with the plan and demonstrated an understanding of the instructions.   The patient was advised to call back or seek an in-person evaluation if the symptoms worsen or if the condition fails to improve as anticipated.   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  Patient preferred to do a phone call.  Patient today reports he is currently grieving the loss of his daughter who passed away few days ago.  He continues to have sleep problems and sadness.  He reports he has been having a lot of nightmares the past few nights and that is affecting his sleep.  He reports he is compliant on his medications as prescribed.  He is currently on Zyprexa.  He however wonders whether the Zyprexa is making him groggy during the day.  He only has been on it since the past few days.  Patient however also has not been sleeping which  in turn could make him groggy during the day.   He reports he is trying to find grief counselor with his church or with hospice.  He has good social support system from family and friends.  Patient denies any suicidality, homicidality or perceptual disturbances.  Patient denies any other concerns today. Visit Diagnosis:    ICD-10-CM   1. Bipolar 1 disorder, mixed, mild (HCC)  F31.61 oxcarbazepine (TRILEPTAL) 600 MG tablet  2. PTSD (post-traumatic stress disorder)  F43.10 prazosin (MINIPRESS) 1 MG capsule  3. Bereavement  Z63.4   4. Panic attacks  F41.0   5. Tobacco use disorder  F17.200     Past Psychiatric History: I have reviewed past psychiatric history from my progress note on 07/19/2018.  Past trials of Seroquel, trazodone, Ativan, mirtazapine, gabapentin.  Past Medical History:  Past Medical History:  Diagnosis Date  . Anxiety   . Coronary artery disease   . Depression   . Hyperlipidemia   . Hypertension   . Myocardial infarction (HCC)   . PTSD (post-traumatic stress disorder)     Past Surgical History:  Procedure Laterality Date  . APPENDECTOMY    . JOINT REPLACEMENT Right    knee  . LUMBAR LAMINECTOMY  2012  . thumb surgery     reimplantation    Family Psychiatric History: I have reviewed family psychiatric history from my progress note on 07/19/2018.  Family History:  Family History  Problem Relation Age of Onset  . Diabetes Mother   .  Depression Mother   . Alzheimer's disease Mother   . Diabetes Father   . Glaucoma Father     Social History: I have reviewed social history from my progress note on 07/19/2018. Social History   Socioeconomic History  . Marital status: Single    Spouse name: Not on file  . Number of children: Not on file  . Years of education: Not on file  . Highest education level: Not on file  Occupational History  . Not on file  Tobacco Use  . Smoking status: Current Every Day Smoker    Packs/day: 0.25    Types: Cigarettes  .  Smokeless tobacco: Never Used  Substance and Sexual Activity  . Alcohol use: No  . Drug use: No  . Sexual activity: Yes  Other Topics Concern  . Not on file  Social History Narrative  . Not on file   Social Determinants of Health   Financial Resource Strain:   . Difficulty of Paying Living Expenses:   Food Insecurity:   . Worried About Charity fundraiser in the Last Year:   . Arboriculturist in the Last Year:   Transportation Needs:   . Film/video editor (Medical):   Marland Kitchen Lack of Transportation (Non-Medical):   Physical Activity:   . Days of Exercise per Week:   . Minutes of Exercise per Session:   Stress:   . Feeling of Stress :   Social Connections:   . Frequency of Communication with Friends and Family:   . Frequency of Social Gatherings with Friends and Family:   . Attends Religious Services:   . Active Member of Clubs or Organizations:   . Attends Archivist Meetings:   Marland Kitchen Marital Status:     Allergies:  Allergies  Allergen Reactions  . Penicillins Anaphylaxis  . Sulfa Antibiotics Anaphylaxis  . Corticosteroids   . Meperidine     Other reaction(s): Unknown  . Prednisone     Metabolic Disorder Labs: No results found for: HGBA1C, MPG No results found for: PROLACTIN No results found for: CHOL, TRIG, HDL, CHOLHDL, VLDL, LDLCALC No results found for: TSH  Therapeutic Level Labs: No results found for: LITHIUM No results found for: VALPROATE No components found for:  CBMZ  Current Medications: Current Outpatient Medications  Medication Sig Dispense Refill  . aspirin EC 81 MG tablet Take 81 mg by mouth daily.    Marland Kitchen atorvastatin (LIPITOR) 40 MG tablet Take 40 mg by mouth daily.    . butalbital-acetaminophen-caffeine (FIORICET) 50-325-40 MG tablet Take by mouth.    . Butalbital-APAP-Caffeine 50-325-40 MG capsule Take 2 capsules by mouth every 8 (eight) hours as needed.    . gabapentin (NEURONTIN) 600 MG tablet TK 1 T PO BID AND 2 TS AT BEDTIME     . hydrOXYzine (VISTARIL) 25 MG capsule Take 1-2 capsules (25-50 mg total) by mouth 2 (two) times daily as needed. Severe anxiety and agitation 120 capsule 1  . Melatonin 10 MG TABS Take 10 mg by mouth at bedtime.    . meloxicam (MOBIC) 15 MG tablet Take by mouth.    . metoprolol succinate (TOPROL-XL) 100 MG 24 hr tablet Take 100 mg by mouth daily. Take with or immediately following a meal.    . metroNIDAZOLE (METROGEL) 0.75 % gel APPLY TO AFFECTED AREA TWICE A DAY    . metroNIDAZOLE (METROGEL) 0.75 % gel Apply topically.    . mirtazapine (REMERON) 30 MG tablet Take 1 tablet (30 mg  total) by mouth at bedtime. 90 tablet 0  . montelukast (SINGULAIR) 10 MG tablet Take 10 mg by mouth at bedtime.    Marland Kitchen OLANZapine (ZYPREXA) 5 MG tablet Take 1 tablet (5 mg total) by mouth at bedtime. 30 tablet 1  . oxcarbazepine (TRILEPTAL) 600 MG tablet TAKE 1/2 TABLET (300 MG ) EVERY MORNING AND 1 & 1/2 TABLETS ( 900 MG ) IN THE EVENING 180 tablet 1  . prazosin (MINIPRESS) 1 MG capsule Take 1 capsule (1 mg total) by mouth at bedtime. 30 capsule 1  . tamsulosin (FLOMAX) 0.4 MG CAPS capsule Take by mouth.    . temazepam (RESTORIL) 30 MG capsule Take 1 capsule (30 mg total) by mouth at bedtime as needed for sleep. 30 capsule 2  . CHANTIX 0.5 MG tablet TAKE 1 TABLET BY MOUTH 2 TIMES DAILY. (Patient not taking: Reported on 06/30/2019) 180 tablet 1  . diclofenac (VOLTAREN) 75 MG EC tablet Take 1 tablet (75 mg total) by mouth 2 (two) times daily as needed (pain flare). (Patient not taking: Reported on 06/30/2019) 30 tablet 2  . sildenafil (REVATIO) 20 MG tablet Take three to five up to once daily as needed for erectile dysfunction.    . traMADol (ULTRAM) 50 MG tablet Take by mouth.     No current facility-administered medications for this visit.     Musculoskeletal: Strength & Muscle Tone: UTA Gait & Station: UTA Patient leans: UTA  Psychiatric Specialty Exam: Review of Systems  Psychiatric/Behavioral: Positive for  dysphoric mood and sleep disturbance.  All other systems reviewed and are negative.   There were no vitals taken for this visit.There is no height or weight on file to calculate BMI.  General Appearance: UTA  Eye Contact:  UTA  Speech:  Clear and Coherent  Volume:  Normal  Mood:  Depressed  Affect:  UTA  Thought Process:  Goal Directed and Descriptions of Associations: Intact  Orientation:  Full (Time, Place, and Person)  Thought Content: Logical   Suicidal Thoughts:  No  Homicidal Thoughts:  No  Memory:  Immediate;   Fair Recent;   Fair Remote;   Fair  Judgement:  Fair  Insight:  Fair  Psychomotor Activity:  UTA  Concentration:  Concentration: Fair and Attention Span: Fair  Recall:  [employer] of Knowledge: Fair  Language: Fair  Akathisia:  No  Handed:  Right  AIMS (if indicated): UTA  Assets:  Communication Skills Desire for Improvement Housing Social Support  ADL's:  Intact  Cognition: WNL  Sleep:  Poor   Screenings: PHQ2-9     Office Visit from 06/11/2018 in [facility]  PHQ-2 Total Score  0       Assessment and Plan: James Bryan is a 59 year old Caucasian male, engaged, retired, lives in [place], has a history of bipolar disorder, PTSD, panic attacks, chronic pain, multiple surgeries, coronary artery disease was evaluated by phone today.  Patient is biologically predisposed given his multiple health problems.  He also has psychosocial stressors of several deaths in the family, chronic pain.  Patient will benefit from psychotherapy sessions and medication changes since he continues to struggle with mood symptoms and sleep problems.  Plan as noted below.  Plan Bipolar disorder-unstable Zyprexa 5 mg p.o. nightly Mirtazapine 30 mg p.o. nightly  Panic attacks-improving Hydroxyzine 25 to 50 mg p.o. twice daily as needed for anxiety attacks Discussed referral for psychotherapy sessions-patient declined  Insomnia-restless Start  prazosin 1 mg p.o. nightly for  nightmares Continue temazepam as prescribed Zyprexa 5 mg p.o. nightly Patient aware about drug to drug interaction between Zyprexa, temazepam. Discussed with patient not to sleep during the day if he is unable to sleep at night and to work on his sleep hygiene.  Bereavement-unstable Provided information for hospice.  He will reach out to them.  Follow-up in clinic in 3 to 4 weeks or sooner if needed.  I have spent atleast 20 minutes non face to face with patient today. More than 50 % of the time for ordering medications and test ,psychoeducation and supportive psychotherapy and care coordination,as well as documenting clinical information in electronic health record. This note was generated in part or whole with voice recognition software. Voice recognition is usually quite accurate but there are transcription errors that can and very often do occur. I apologize for any typographical errors that were not detected and corrected.        Jomarie Longs, MD 06/30/2019, 5:30 PM

## 2019-07-22 ENCOUNTER — Other Ambulatory Visit: Payer: Self-pay

## 2019-07-22 ENCOUNTER — Other Ambulatory Visit: Payer: Self-pay | Admitting: Psychiatry

## 2019-07-22 ENCOUNTER — Telehealth (INDEPENDENT_AMBULATORY_CARE_PROVIDER_SITE_OTHER): Payer: Medicare Other | Admitting: Psychiatry

## 2019-07-22 ENCOUNTER — Encounter: Payer: Self-pay | Admitting: Psychiatry

## 2019-07-22 DIAGNOSIS — F3177 Bipolar disorder, in partial remission, most recent episode mixed: Secondary | ICD-10-CM | POA: Diagnosis not present

## 2019-07-22 DIAGNOSIS — F410 Panic disorder [episodic paroxysmal anxiety] without agoraphobia: Secondary | ICD-10-CM | POA: Diagnosis not present

## 2019-07-22 DIAGNOSIS — F4310 Post-traumatic stress disorder, unspecified: Secondary | ICD-10-CM

## 2019-07-22 DIAGNOSIS — Z634 Disappearance and death of family member: Secondary | ICD-10-CM

## 2019-07-22 DIAGNOSIS — F17200 Nicotine dependence, unspecified, uncomplicated: Secondary | ICD-10-CM

## 2019-07-22 MED ORDER — TEMAZEPAM 30 MG PO CAPS: 30.0000 mg | ORAL_CAPSULE | Freq: Every evening | ORAL | 2 refills | Status: DC | PRN

## 2019-07-22 NOTE — Progress Notes (Signed)
Provider Location : [facility] Patient Location : Home  Virtual Visit via Telephone Note  I connected with James Bryan on 07/22/19 at  4:00 PM EDT by telephone and verified that I am speaking with the correct person using two identifiers.   I discussed the limitations, risks, security and privacy concerns of performing an evaluation and management service by telephone and the availability of in person appointments. I also discussed with the patient that there may be a patient responsible charge related to this service. The patient expressed understanding and agreed to proceed.      I discussed the assessment and treatment plan with the patient. The patient was provided an opportunity to ask questions and all were answered. The patient agreed with the plan and demonstrated an understanding of the instructions.   The patient was advised to call back or seek an in-person evaluation if the symptoms worsen or if the condition fails to improve as anticipated.   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  Patient was offered a video call however he preferred to do a phone call today.  Patient continues to grieve the loss of his daughter who passed away.  His father passed away in 20-Mar-2019.  Patient reports he is currently scheduled to start grief counseling at his church.  He looks forward to that.  Patient reports he is currently taking moment by moment.  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  He hence feels as though he is surrounded by death all the time.  Patient reports the Zyprexa however has been beneficial for his  mood.  He did have trouble sleeping initially however that has improved.  He also denies any nightmares.  He is compliant on the prazosin.  Patient reports he is compliant on all his medications and denies side effects.  Patient denies any suicidality, homicidality or perceptual disturbances.  He continues to smoke cigarettes however reports he is not ready to quit at this time.  Patient denies any other concerns today. Visit Diagnosis:    ICD-10-CM   1. Bipolar disorder, in partial remission, most recent episode mixed (HCC)  F31.77   2. PTSD (post-traumatic stress disorder)  F43.10 temazepam (RESTORIL) 30 MG capsule  3. Bereavement  Z63.4   4. Panic attacks  F41.0   5. Tobacco use disorder  F17.200     Past Psychiatric History: I have reviewed past psychiatric history from my progress note on 07/19/2018.  Past trials of Seroquel, trazodone, Ativan, mirtazapine, gabapentin.  Past Medical History:  Past Medical History:  Diagnosis Date  . Anxiety   . Coronary artery disease   . Depression   . Hyperlipidemia   . Hypertension   . Myocardial infarction (HCC)   . PTSD (post-traumatic stress disorder)     Past Surgical History:  Procedure Laterality Date  . APPENDECTOMY    . JOINT REPLACEMENT Right    knee  . LUMBAR LAMINECTOMY  2012  . thumb surgery     reimplantation    Family Psychiatric History: I have reviewed family psychiatric history from my progress note on  07/19/2018.  Family History:  Family History  Problem Relation Age of Onset  . Diabetes Mother   . Depression Mother   . Alzheimer's disease Mother   . Diabetes Father   . Glaucoma Father     Social History: I have reviewed social history from my progress note on 07/19/2018. Social History   Socioeconomic History  . Marital status: Single    Spouse name: Not on file  . Number of children: Not on file  . Years of education: Not on file  . Highest education level: Not on file  Occupational History  .  Not on file  Tobacco Use  . Smoking status: Current Every Day Smoker    Packs/day: 0.25    Types: Cigarettes  . Smokeless tobacco: Never Used  Substance and Sexual Activity  . Alcohol use: No  . Drug use: No  . Sexual activity: Yes  Other Topics Concern  . Not on file  Social History Narrative  . Not on file   Social Determinants of Health   Financial Resource Strain:   . Difficulty of Paying Living Expenses:   Food Insecurity:   . Worried About Programme researcher, broadcasting/film/video in the Last Year:   . Barista in the Last Year:   Transportation Needs:   . Freight forwarder (Medical):   Marland Kitchen Lack of Transportation (Non-Medical):   Physical Activity:   . Days of Exercise per Week:   . Minutes of Exercise per Session:   Stress:   . Feeling of Stress :   Social Connections:   . Frequency of Communication with Friends and Family:   . Frequency of Social Gatherings with Friends and Family:   . Attends Religious Services:   . Active Member of Clubs or Organizations:   . Attends Banker Meetings:   Marland Kitchen Marital Status:     Allergies:  Allergies  Allergen Reactions  . Penicillins Anaphylaxis  . Sulfa Antibiotics Anaphylaxis  . Corticosteroids   . Meperidine     Other reaction(s): Unknown  . Prednisone     Metabolic Disorder Labs: No results found for: HGBA1C, MPG No results found for: PROLACTIN No results found for: CHOL, TRIG, HDL, CHOLHDL, VLDL, LDLCALC No results found for: TSH  Therapeutic Level Labs: No results found for: LITHIUM No results found for: VALPROATE No components found for:  CBMZ  Current Medications: Current Outpatient Medications  Medication Sig Dispense Refill  . aspirin EC 81 MG tablet Take 81 mg by mouth daily.    Marland Kitchen atorvastatin (LIPITOR) 40 MG tablet Take 40 mg by mouth daily.    . butalbital-acetaminophen-caffeine (FIORICET) 50-325-40 MG tablet Take by mouth.    . Butalbital-APAP-Caffeine 50-325-40 MG capsule Take 2 capsules by  mouth every 8 (eight) hours as needed.    . CHANTIX 0.5 MG tablet TAKE 1 TABLET BY MOUTH 2 TIMES DAILY. (Patient not taking: Reported on 06/30/2019) 180 tablet 1  . diclofenac (VOLTAREN) 75 MG EC tablet Take 1 tablet (75 mg total) by mouth 2 (two) times daily as needed (pain flare). (Patient not taking: Reported on 06/30/2019) 30 tablet 2  . gabapentin (NEURONTIN) 600 MG tablet TK 1 T PO BID AND 2 TS AT BEDTIME    . hydrOXYzine (VISTARIL) 25 MG capsule Take 1-2 capsules (25-50 mg total) by mouth 2 (two) times daily as needed. Severe anxiety and agitation 120 capsule 1  . Melatonin 10 MG TABS Take 10 mg by mouth at bedtime.    Marland Kitchen  meloxicam (MOBIC) 15 MG tablet Take by mouth.    . metoprolol succinate (TOPROL-XL) 100 MG 24 hr tablet Take 100 mg by mouth daily. Take with or immediately following a meal.    . metroNIDAZOLE (METROGEL) 0.75 % gel APPLY TO AFFECTED AREA TWICE A DAY    . metroNIDAZOLE (METROGEL) 0.75 % gel Apply topically.    . mirtazapine (REMERON) 30 MG tablet Take 1 tablet (30 mg total) by mouth at bedtime. 90 tablet 0  . montelukast (SINGULAIR) 10 MG tablet Take 10 mg by mouth at bedtime.    Marland Kitchen OLANZapine (ZYPREXA) 5 MG tablet Take 1 tablet (5 mg total) by mouth at bedtime. 30 tablet 1  . oxcarbazepine (TRILEPTAL) 600 MG tablet TAKE 1/2 TABLET (300 MG ) EVERY MORNING AND 1 & 1/2 TABLETS ( 900 MG ) IN THE EVENING 180 tablet 1  . prazosin (MINIPRESS) 1 MG capsule Take 1 capsule (1 mg total) by mouth at bedtime. 30 capsule 1  . sildenafil (REVATIO) 20 MG tablet Take three to five up to once daily as needed for erectile dysfunction.    . tamsulosin (FLOMAX) 0.4 MG CAPS capsule Take by mouth.    . temazepam (RESTORIL) 30 MG capsule Take 1 capsule (30 mg total) by mouth at bedtime as needed for sleep. 30 capsule 2  . traMADol (ULTRAM) 50 MG tablet Take by mouth.     No current facility-administered medications for this visit.     Musculoskeletal: Strength & Muscle Tone: UTA Gait & Station:  UTA Patient leans: N/A  Psychiatric Specialty Exam: Review of Systems  Psychiatric/Behavioral: Positive for dysphoric mood (improving) and sleep disturbance (improving).  All other systems reviewed and are negative.   There were no vitals taken for this visit.There is no height or weight on file to calculate BMI.  General Appearance: UTA  Eye Contact:  UTA  Speech:  Clear and Coherent  Volume:  Normal  Mood:  Depressed improving  Affect:  UTA  Thought Process:  Goal Directed and Descriptions of Associations: Intact  Orientation:  Full (Time, Place, and Person)  Thought Content: Logical   Suicidal Thoughts:  No  Homicidal Thoughts:  No  Memory:  Immediate;   Fair Recent;   Fair Remote;   Fair  Judgement:  Fair  Insight:  Fair  Psychomotor Activity:  UTA  Concentration:  Concentration: Fair and Attention Span: Fair  Recall:  [employer] of Knowledge: Fair  Language: Fair  Akathisia:  No  Handed:  Right  AIMS (if indicated): UTA  Assets:  Communication Skills Desire for Improvement Housing Social Support  ADL's:  Intact  Cognition: WNL  Sleep:  Improving   Screenings: PHQ2-9     Office Visit from 06/11/2018 in [facility]  PHQ-2 Total Score  0       Assessment and Plan: Brandell is a 59 year old Caucasian male, engaged, retired, lives in [place], has a history of bipolar disorder, PTSD, panic attacks, chronic pain, multiple surgeries, coronary artery disease was evaluated by phone today.  Patient is biologically predisposed given his multiple health issues.  Patient with psychosocial stressors of recent death in the family, chronic pain.  Patient however is currently making progress on the current medication regimen.  Plan as noted below.  Plan Bipolar disorder in partial remission Zyprexa 5 mg p.o. nightly Mirtazapine 30 mg p.o. nightly Trileptal as prescribed  Panic attacks-improving Hydroxyzine 25 to 50 mg p.o. twice  daily as needed for  anxiety attacks Patient was offered CBT referral in the past however he declined.  Insomnia-improving Prazosin 1 mg p.o. nightly for nightmares Temazepam 30 mg p.o. nightly I have reviewed [place] controlled substance database. Patient is also on Zyprexa which also helps. He is aware about the interaction between Zyprexa and temazepam,  will monitor closely  Bereavement -improving Patient will start grief counseling with [facility], [place].  Tobacco use disorder-unstable Provided smoking cessation counseling.  He is not ready to quit.  I have reviewed the following labs in [facility] dated 01/30/2019-lipid panel-abnormal, TSH-within normal limits, T4-low, hemoglobin A1c-dated 04/18/2018-within normal limits. Patient reports he had recent repeat labs done and he will fax it to [place].  We will coordinate care with his primary care provider.   Follow-up in clinic in 4 to 6 weeks or sooner if needed.  I have spent atleast 20 minutes non face to face with patient today. More than 50 % of the time was spent for preparing to see the patient ( e.g., review of test, records ), obtaining and to review and separately obtained history , ordering medications and test ,psychoeducation and supportive psychotherapy and care coordination,as well as documenting clinical information in electronic health record. This note was generated in part or whole with voice recognition software. Voice recognition is usually quite accurate but there are transcription errors that can and very often do occur. I apologize for any typographical errors that were not detected and corrected.          Jomarie Longs, MD 07/22/2019, 4:35 PM

## 2019-08-19 ENCOUNTER — Other Ambulatory Visit: Payer: Self-pay | Admitting: Psychiatry

## 2019-08-19 DIAGNOSIS — F3161 Bipolar disorder, current episode mixed, mild: Secondary | ICD-10-CM

## 2019-08-20 ENCOUNTER — Other Ambulatory Visit: Payer: Self-pay | Admitting: Psychiatry

## 2019-08-20 DIAGNOSIS — F4310 Post-traumatic stress disorder, unspecified: Secondary | ICD-10-CM

## 2019-08-26 ENCOUNTER — Encounter: Payer: Self-pay | Admitting: Psychiatry

## 2019-08-26 ENCOUNTER — Other Ambulatory Visit: Payer: Self-pay

## 2019-08-26 ENCOUNTER — Telehealth (INDEPENDENT_AMBULATORY_CARE_PROVIDER_SITE_OTHER): Payer: Medicare Other | Admitting: Psychiatry

## 2019-08-26 DIAGNOSIS — F410 Panic disorder [episodic paroxysmal anxiety] without agoraphobia: Secondary | ICD-10-CM | POA: Diagnosis not present

## 2019-08-26 DIAGNOSIS — F4310 Post-traumatic stress disorder, unspecified: Secondary | ICD-10-CM

## 2019-08-26 DIAGNOSIS — F3178 Bipolar disorder, in full remission, most recent episode mixed: Secondary | ICD-10-CM | POA: Diagnosis not present

## 2019-08-26 DIAGNOSIS — Z634 Disappearance and death of family member: Secondary | ICD-10-CM | POA: Diagnosis not present

## 2019-08-26 DIAGNOSIS — Z79899 Other long term (current) drug therapy: Secondary | ICD-10-CM

## 2019-08-26 DIAGNOSIS — F17200 Nicotine dependence, unspecified, uncomplicated: Secondary | ICD-10-CM

## 2019-08-26 MED ORDER — OLANZAPINE 5 MG PO TABS: 5.0000 mg | ORAL_TABLET | Freq: Every day | ORAL | 0 refills | Status: DC

## 2019-08-26 NOTE — Progress Notes (Signed)
Provider Location : [facility] Patient Location : Home  Virtual Visit via Video Note  I connected with James Bryan on 08/26/19 at  3:30 PM EDT by a video enabled telemedicine application and verified that I am speaking with the correct person using two identifiers.   I discussed the limitations of evaluation and management by telemedicine and the availability of in person appointments. The patient expressed understanding and agreed to proceed.     I discussed the assessment and treatment plan with the patient. The patient was provided an opportunity to ask questions and all were answered. The patient agreed with the plan and demonstrated an understanding of the instructions.   The patient was advised to call back or seek an in-person evaluation if the symptoms worsen or if the condition fails to improve as anticipated.  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  Patient today reports he is currently coping with this loss better than before.  He is taking it one day at a time.  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  He denies any significant mood lability or depression or anxiety symptoms.  He reports sleep continues to be good.  He is compliant on medications as prescribed.  Patient denies any suicidality, homicidality or perceptual disturbances.  He reports he continues to smoke cigarettes and is not on the Chantix since it was not affordable.  He reports he is cutting back on smoking since he is busy at work and that does help.  Patient denies any other concerns today.  Visit Diagnosis:    ICD-10-CM    1. Bipolar disorder, in full remission, most recent episode mixed (HCC)  F31.78   2. PTSD (post-traumatic stress disorder)  F43.10 OLANZapine (ZYPREXA) 5 MG tablet  3. Bereavement  Z63.4   4. Panic attacks  F41.0   5. Tobacco use disorder  F17.200   6. High risk medication use  Z79.899 Hemoglobin A1C    CBC With Differential    Comprehensive metabolic panel    Prolactin    Past Psychiatric History: I have reviewed past psychiatric history from my progress note on 07/19/2018.  Past trials of Seroquel, trazodone, Ativan, mirtazapine, gabapentin  Past Medical History:  Past Medical History:  Diagnosis Date  . Anxiety   . Coronary artery disease   . Depression   . Hyperlipidemia   . Hypertension   . Myocardial infarction (HCC)   . PTSD (post-traumatic stress disorder)     Past Surgical History:  Procedure Laterality Date  . APPENDECTOMY    . JOINT REPLACEMENT Right    knee  . LUMBAR LAMINECTOMY  2012  . thumb surgery     reimplantation    Family Psychiatric History: I have reviewed family psychiatric history from my progress note on 07/19/2018  Family History:  Family History  Problem Relation Age of Onset  . Diabetes Mother   . Depression Mother   . Alzheimer's disease Mother   . Diabetes Father   . Glaucoma Father     Social History: Reviewed social history from my progress note on 07/19/2018 Social  History   Socioeconomic History  . Marital status: Single    Spouse name: Not on file  . Number of children: Not on file  . Years of education: Not on file  . Highest education level: Not on file  Occupational History  . Not on file  Tobacco Use  . Smoking status: Current Every Day Smoker    Packs/day: 0.25    Types: Cigarettes  . Smokeless tobacco: Never Used  Substance and Sexual Activity  . Alcohol use: No  . Drug use: No  . Sexual activity: Yes  Other Topics Concern  . Not on file  Social History Narrative  . Not on file   Social Determinants of  Health   Financial Resource Strain:   . Difficulty of Paying Living Expenses:   Food Insecurity:   . Worried About Programme researcher, broadcasting/film/video in the Last Year:   . Barista in the Last Year:   Transportation Needs:   . Freight forwarder (Medical):   Marland Kitchen Lack of Transportation (Non-Medical):   Physical Activity:   . Days of Exercise per Week:   . Minutes of Exercise per Session:   Stress:   . Feeling of Stress :   Social Connections:   . Frequency of Communication with Friends and Family:   . Frequency of Social Gatherings with Friends and Family:   . Attends Religious Services:   . Active Member of Clubs or Organizations:   . Attends Banker Meetings:   Marland Kitchen Marital Status:     Allergies:  Allergies  Allergen Reactions  . Penicillins Anaphylaxis  . Sulfa Antibiotics Anaphylaxis  . Corticosteroids   . Meperidine     Other reaction(s): Unknown  . Prednisone     Metabolic Disorder Labs: No results found for: HGBA1C, MPG No results found for: PROLACTIN No results found for: CHOL, TRIG, HDL, CHOLHDL, VLDL, LDLCALC No results found for: TSH  Therapeutic Level Labs: No results found for: LITHIUM No results found for: VALPROATE No components found for:  CBMZ  Current Medications: Current Outpatient Medications  Medication Sig Dispense Refill  . aspirin EC 81 MG tablet Take 81 mg by mouth daily.    Marland Kitchen atorvastatin (LIPITOR) 40 MG tablet Take 40 mg by mouth daily.    . butalbital-acetaminophen-caffeine (FIORICET WITH CODEINE) 50-325-40-30 MG capsule Take by mouth.    . butalbital-acetaminophen-caffeine (FIORICET) 50-325-40 MG tablet Take by mouth.    . diclofenac (VOLTAREN) 75 MG EC tablet Take 1 tablet (75 mg total) by mouth 2 (two) times daily as needed (pain flare). 30 tablet 2  . gabapentin (NEURONTIN) 600 MG tablet TK 1 T PO BID AND 2 TS AT BEDTIME    . hydrOXYzine (VISTARIL) 25 MG capsule Take 1-2 capsules (25-50 mg total) by mouth 2 (two) times daily as  needed. Severe anxiety and agitation 120 capsule 1  . Melatonin 10 MG TABS Take 10 mg by mouth at bedtime.    . meloxicam (MOBIC) 15 MG tablet Take by mouth.    . metoprolol succinate (TOPROL-XL) 100 MG 24 hr tablet Take 100 mg by mouth daily. Take with or immediately following a meal.    . metroNIDAZOLE (METROGEL) 0.75 % gel APPLY TO AFFECTED AREA TWICE A DAY    . metroNIDAZOLE (METROGEL) 0.75 % gel Apply topically.    . mirtazapine (REMERON) 30 MG tablet TAKE 1 TABLET BY MOUTH AT BEDTIME. 90 tablet 0  . montelukast (SINGULAIR) 10 MG tablet Take 10  mg by mouth at bedtime.    Marland Kitchen OLANZapine (ZYPREXA) 5 MG tablet Take 1 tablet (5 mg total) by mouth at bedtime. 90 tablet 0  . oxcarbazepine (TRILEPTAL) 600 MG tablet TAKE 1/2 TABLET (300 MG ) EVERY MORNING AND 1 & 1/2 TABLETS ( 900 MG ) IN THE EVENING 180 tablet 1  . prazosin (MINIPRESS) 1 MG capsule TAKE 1 CAPSULE (1 MG TOTAL) BY MOUTH AT BEDTIME. 30 capsule 1  . sildenafil (REVATIO) 20 MG tablet Take three to five up to once daily as needed for erectile dysfunction.    . tamsulosin (FLOMAX) 0.4 MG CAPS capsule Take by mouth.    . temazepam (RESTORIL) 30 MG capsule Take 1 capsule (30 mg total) by mouth at bedtime as needed for sleep. 30 capsule 2  . Butalbital-APAP-Caffeine 50-325-40 MG capsule Take 2 capsules by mouth every 8 (eight) hours as needed.    . CHANTIX 0.5 MG tablet TAKE 1 TABLET BY MOUTH 2 TIMES DAILY. (Patient not taking: Reported on 06/30/2019) 180 tablet 1   No current facility-administered medications for this visit.     Musculoskeletal: Strength & Muscle Tone: UTA Gait & Station: normal Patient leans: N/A  Psychiatric Specialty Exam: Review of Systems  Psychiatric/Behavioral: Negative for agitation, behavioral problems, confusion, decreased concentration, dysphoric mood, hallucinations, self-injury, sleep disturbance and suicidal ideas. The patient is not nervous/anxious and is not hyperactive.   All other systems reviewed and  are negative.   There were no vitals taken for this visit.There is no height or weight on file to calculate BMI.  General Appearance: Casual  Eye Contact:  Fair  Speech:  Normal Rate  Volume:  Normal  Mood:  Euthymic  Affect:  Congruent  Thought Process:  Goal Directed and Descriptions of Associations: Intact  Orientation:  Full (Time, Place, and Person)  Thought Content: Logical   Suicidal Thoughts:  No  Homicidal Thoughts:  No  Memory:  Immediate;   Fair Recent;   Fair Remote;   Fair  Judgement:  Fair  Insight:  Fair  Psychomotor Activity:  Normal  Concentration:  Concentration: Fair and Attention Span: Fair  Recall:  [employer] of Knowledge: Fair  Language: Fair  Akathisia:  No  Handed:  Right  AIMS (if indicated): UTA  Assets:  Communication Skills Desire for Improvement Housing Social Support  ADL's:  Intact  Cognition: WNL  Sleep:  Fair   Screenings: PHQ2-9     Office Visit from 06/11/2018 in [facility]  PHQ-2 Total Score  0       Assessment and Plan: James Bryan is a 60 year old Caucasian male, engaged, retired, lives in [place], has a history of bipolar disorder, PTSD, panic attacks, chronic pain, multiple surgeries, coronary artery disease was evaluated by telemedicine today.  Patient is biologically predisposed given his multiple health issues.  Patient with psychosocial stressors of recent death in the family, chronic pain.  Patient however is currently making progress.  Plan as noted below.  Plan Bipolar disorder in remission Zyprexa 5 mg p.o. nightly.  Discussed with patient that the long-term goal is to taper him off of the medication-Zyprexa. Mirtazapine 30 mg p.o. nightly Trileptal 300 mg p.o. daily in the morning and 900 mg every afternoon   Panic attacks-improving Hydroxyzine 25 to 50 mg p.o. twice daily as needed for anxiety attacks Patient was referred for CBT-declined  PTSD-stable Continue to  monitor closely  Insomnia-improving Prazosin 1 mg p.o. nightly Temazepam 30  mg p.o. nightly I have reviewed [place] controlled substance database  Tobacco use disorder-improving Provided smoking cessation counseling  Bereavement-improving Patient reports he did not go for the grief counseling with [facility].  He however reports good support system from his partner and reports he is making progress.  Patient will benefit from the following labs-lipid panel, hemoglobin A1c, prolactin, CBC and CMP.  Patient agrees to get it done at his primary care office.  Follow-up in clinic in 6 to 8 weeks or sooner if needed.  I have spent atleast 20 minutes non face to face with patient today. More than 50 % of the time was spent for preparing to see the patient ( e.g., review of test, records ), ordering medications and test ,psychoeducation and supportive psychotherapy and care coordination,as well as documenting clinical information in electronic health record. This note was generated in part or whole with voice recognition software. Voice recognition is usually quite accurate but there are transcription errors that can and very often do occur. I apologize for any typographical errors that were not detected and corrected.       Jomarie Longs, MD 08/26/2019, 3:49 PM

## 2019-09-11 DIAGNOSIS — B029 Zoster without complications: Secondary | ICD-10-CM | POA: Insufficient documentation

## 2019-09-11 DIAGNOSIS — R55 Syncope and collapse: Secondary | ICD-10-CM | POA: Insufficient documentation

## 2019-09-11 DIAGNOSIS — G44221 Chronic tension-type headache, intractable: Secondary | ICD-10-CM | POA: Insufficient documentation

## 2019-09-11 DIAGNOSIS — F339 Major depressive disorder, recurrent, unspecified: Secondary | ICD-10-CM | POA: Insufficient documentation

## 2019-09-19 ENCOUNTER — Other Ambulatory Visit: Payer: Self-pay | Admitting: Psychiatry

## 2019-09-19 DIAGNOSIS — F4310 Post-traumatic stress disorder, unspecified: Secondary | ICD-10-CM

## 2019-10-20 ENCOUNTER — Other Ambulatory Visit: Payer: Self-pay | Admitting: Psychiatry

## 2019-10-20 DIAGNOSIS — F4310 Post-traumatic stress disorder, unspecified: Secondary | ICD-10-CM

## 2019-10-27 ENCOUNTER — Telehealth: Payer: Self-pay

## 2019-10-27 DIAGNOSIS — F410 Panic disorder [episodic paroxysmal anxiety] without agoraphobia: Secondary | ICD-10-CM

## 2019-10-27 DIAGNOSIS — F4310 Post-traumatic stress disorder, unspecified: Secondary | ICD-10-CM

## 2019-10-27 DIAGNOSIS — F3161 Bipolar disorder, current episode mixed, mild: Secondary | ICD-10-CM

## 2019-10-27 DIAGNOSIS — Z634 Disappearance and death of family member: Secondary | ICD-10-CM

## 2019-10-27 MED ORDER — TEMAZEPAM 30 MG PO CAPS: 30.0000 mg | ORAL_CAPSULE | Freq: Every evening | ORAL | 2 refills | Status: AC | PRN

## 2019-10-27 MED ORDER — MIRTAZAPINE 30 MG PO TABS: 30.0000 mg | ORAL_TABLET | Freq: Every day | ORAL | 0 refills | Status: AC

## 2019-10-27 MED ORDER — HYDROXYZINE PAMOATE 25 MG PO CAPS: 25.0000 mg | ORAL_CAPSULE | Freq: Two times a day (BID) | ORAL | 1 refills | Status: AC | PRN

## 2019-10-27 MED ORDER — OLANZAPINE 5 MG PO TABS: 5.0000 mg | ORAL_TABLET | Freq: Every day | ORAL | 0 refills | Status: DC

## 2019-10-27 NOTE — Telephone Encounter (Signed)
pt called left a messae that he needs refills on all of his medications.

## 2019-10-27 NOTE — Telephone Encounter (Signed)
I have sent his medications to pharmacy as requested.

## 2019-11-04 ENCOUNTER — Telehealth (INDEPENDENT_AMBULATORY_CARE_PROVIDER_SITE_OTHER): Payer: Medicare Other | Admitting: Psychiatry

## 2019-11-04 ENCOUNTER — Encounter: Payer: Self-pay | Admitting: Psychiatry

## 2019-11-04 ENCOUNTER — Other Ambulatory Visit: Payer: Self-pay

## 2019-11-04 DIAGNOSIS — F4310 Post-traumatic stress disorder, unspecified: Secondary | ICD-10-CM | POA: Diagnosis not present

## 2019-11-04 DIAGNOSIS — F17200 Nicotine dependence, unspecified, uncomplicated: Secondary | ICD-10-CM

## 2019-11-04 DIAGNOSIS — F410 Panic disorder [episodic paroxysmal anxiety] without agoraphobia: Secondary | ICD-10-CM | POA: Diagnosis not present

## 2019-11-04 DIAGNOSIS — F3161 Bipolar disorder, current episode mixed, mild: Secondary | ICD-10-CM | POA: Diagnosis not present

## 2019-11-04 DIAGNOSIS — F3177 Bipolar disorder, in partial remission, most recent episode mixed: Secondary | ICD-10-CM | POA: Insufficient documentation

## 2019-11-04 DIAGNOSIS — R41840 Attention and concentration deficit: Secondary | ICD-10-CM

## 2019-11-04 DIAGNOSIS — Z634 Disappearance and death of family member: Secondary | ICD-10-CM | POA: Diagnosis not present

## 2019-11-04 MED ORDER — OLANZAPINE 7.5 MG PO TABS: 7.5000 mg | ORAL_TABLET | Freq: Every day | ORAL | 1 refills | Status: AC

## 2019-11-04 NOTE — Progress Notes (Signed)
Provider Location : [facility] Patient Location : [place]  Participants: Patient , Provider  Virtual Visit via Video Note  I connected with James Bryan on 11/04/19 at  3:00 PM EDT by a video enabled telemedicine application and verified that I am speaking with the correct person using two identifiers.   I discussed the limitations of evaluation and management by telemedicine and the availability of in person appointments. The patient expressed understanding and agreed to proceed.     I discussed the assessment and treatment plan with the patient. The patient was provided an opportunity to ask questions and all were answered. The patient agreed with the plan and demonstrated an understanding of the instructions.   The patient was advised to call back or seek an in-person evaluation if the symptoms worsen or if the condition fails to improve as anticipated.  Video connection was lost at less than 50% of the duration of the visit, at which time the remainder of the visit was completed through audio only  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  Patient today reports that he was admitted to the hospital for intestinal ischemia in June 2021.  Patient reports he continues to follow-up with his providers for the same.  He however reports he was taken off of the Trileptal during this admission.  Patient reports he has noticed mood lability, irritability and concentration problems since the past few months.  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 Patient reports he  is currently back at his old position and continues to work there.  That seems to be going okay.  He however reports he worries about his attention and focus problems and wonders whether he needs to be on ADHD medications.  He reports he may have been diagnosed with ADHD several years ago.  He may have taken medications for ADHD in the past and it may have helped him at that point.  Patient reports he had a discussion with his gastroenterologist recently and they also seem to think that he may have ADD from what he is describing and hence needs to be back on medications.  Patient reports sleep continues to be good on the temazepam.  He also takes Zyprexa at bedtime and denies side effects.  Patient continues to smoke cigarettes.  Patient denies any suicidality, homicidality or perceptual disturbances.  Patient reports he is currently not in therapy and reports he is coping okay with his grief.  Patient denies any other concerns today.  Visit Diagnosis:    ICD-10-CM   1. Bipolar disorder, current episode mixed, mild (HCC)  F31.61   2. PTSD (post-traumatic stress disorder)  F43.10 OLANZapine (ZYPREXA) 7.5 MG tablet  3. Bereavement  Z63.4   4. Panic attacks  F41.0   5. Attention and concentration deficit  R41.840   6. Tobacco use disorder  F17.200     Past Psychiatric History: I have reviewed past psychiatric history from my progress note on 07/19/2018.  Past trials of Seroquel, trazodone, Ativan, mirtazapine, gabapentin  Past Medical History:  Past Medical History:  Diagnosis Date  . Anxiety   . Coronary artery disease   . Depression   . Hyperlipidemia   . Hypertension   . Myocardial infarction (HCC)   . PTSD (post-traumatic stress disorder)     Past Surgical History:  Procedure Laterality Date  . APPENDECTOMY    . JOINT REPLACEMENT Right    knee  . LUMBAR LAMINECTOMY  2012  . thumb surgery     reimplantation    Family Psychiatric History: I have reviewed family  psychiatric history from my progress note on 07/19/2018  Family History:  Family History  Problem Relation Age of Onset  . Diabetes Mother   . Depression Mother   . Alzheimer's disease Mother   . Diabetes Father   . Glaucoma Father     Social History: I have reviewed social history from my progress note on 07/19/2018 Social History   Socioeconomic History  . Marital status: Single    Spouse name: Not on file  . Number of children: Not on file  . Years of education: Not on file  . Highest education level: Not on file  Occupational History  . Not on file  Tobacco Use  . Smoking status: Current Every Day Smoker    Packs/day: 0.25    Types: Cigarettes  . Smokeless tobacco: Never Used  Vaping Use  . Vaping Use: Never used  Substance and Sexual Activity  . Alcohol use: No  . Drug use: No  . Sexual activity: Yes  Other Topics Concern  . Not on file  Social History Narrative  . Not on file   Social Determinants of Health   Financial Resource Strain:   . Difficulty of Paying Living Expenses:   Food Insecurity:   . Worried About Programme researcher, broadcasting/film/videounning Out of Food in the Last Year:   . Baristaan Out of Food in the Last Year:   Transportation Needs:   . Freight forwarderLack of Transportation (Medical):   Marland Kitchen. Lack of Transportation (Non-Medical):   Physical Activity:   . Days of Exercise per Week:   . Minutes of Exercise per Session:   Stress:   . Feeling of Stress :   Social Connections:   . Frequency of Communication with Friends and Family:   . Frequency of Social Gatherings with Friends and Family:   . Attends Religious Services:   . Active Member of Clubs or Organizations:   . Attends BankerClub or Organization Meetings:   Marland Kitchen. Marital Status:     Allergies:  Allergies  Allergen Reactions  . Penicillins Anaphylaxis  . Sulfa Antibiotics Anaphylaxis  . Corticosteroids   . Meperidine     Other reaction(s): Unknown  . Prednisone     Metabolic Disorder Labs: No results found for: HGBA1C, MPG No results  found for: PROLACTIN No results found for: CHOL, TRIG, HDL, CHOLHDL, VLDL, LDLCALC No results found for: TSH  Therapeutic Level Labs: No results found for: LITHIUM No results found for: VALPROATE No components found for:  CBMZ  Current Medications: Current Outpatient Medications  Medication Sig Dispense Refill  . atorvastatin (LIPITOR) 40 MG tablet Take 40 mg by mouth daily.    . butalbital-acetaminophen-caffeine (FIORICET) 50-325-40 MG tablet Take by mouth.    . gabapentin (NEURONTIN) 600 MG tablet TK 1 T PO BID AND 2 TS AT BEDTIME    . hydrOXYzine (VISTARIL) 25 MG capsule Take 1-2 capsules (25-50 mg total) by mouth 2 (two) times daily as needed. Severe anxiety and agitation 120 capsule  1  . Melatonin 10 MG TABS Take 10 mg by mouth at bedtime.    . metoprolol succinate (TOPROL-XL) 100 MG 24 hr tablet Take 100 mg by mouth daily. Take with or immediately following a meal.    . metroNIDAZOLE (METROGEL) 0.75 % gel APPLY TO AFFECTED AREA TWICE A DAY    . mirtazapine (REMERON) 30 MG tablet Take 1 tablet (30 mg total) by mouth at bedtime. 90 tablet 0  . montelukast (SINGULAIR) 10 MG tablet Take 10 mg by mouth at bedtime.    . potassium chloride SA (KLOR-CON) 20 MEQ tablet Take by mouth.    . prazosin (MINIPRESS) 1 MG capsule TAKE 1 CAPSULE (1 MG TOTAL) BY MOUTH AT BEDTIME. 30 capsule 1  . tamsulosin (FLOMAX) 0.4 MG CAPS capsule Take by mouth.    . temazepam (RESTORIL) 30 MG capsule Take 1 capsule (30 mg total) by mouth at bedtime as needed for sleep. 30 capsule 2  . topiramate (TOPAMAX) 50 MG tablet Take 1 tablet by mouth at bedtime.    Marland Kitchen OLANZapine (ZYPREXA) 7.5 MG tablet Take 1 tablet (7.5 mg total) by mouth at bedtime. 30 tablet 1   No current facility-administered medications for this visit.     Musculoskeletal: Strength & Muscle Tone: UTA Gait & Station: UTA Patient leans: N/A  Psychiatric Specialty Exam: Review of Systems  Psychiatric/Behavioral: Positive for decreased  concentration. The patient is nervous/anxious.   All other systems reviewed and are negative.   There were no vitals taken for this visit.There is no height or weight on file to calculate BMI.  General Appearance: UTA  Eye Contact:  UTA  Speech:  Clear and Coherent  Volume:  Normal  Mood:  mood swings , irritability  Affect:  UTA  Thought Process:  Goal Directed and Descriptions of Associations: Intact  Orientation:  Full (Time, Place, and Person)  Thought Content: Logical   Suicidal Thoughts:  No  Homicidal Thoughts:  No  Memory:  Immediate;   Fair Recent;   Fair Remote;   Fair  Judgement:  Fair  Insight:  Fair  Psychomotor Activity:  UTA  Concentration:  Concentration: Fair and Attention Span: Fair  Recall:  [employer] of Knowledge: Fair  Language: Fair  Akathisia:  No  Handed:  Right  AIMS (if indicated): UTA  Assets:  Communication Skills Desire for Improvement Housing Social Support  ADL's:  Intact  Cognition: WNL  Sleep:  Fair   Screenings: PHQ2-9     [facility] Visit from 06/11/2018 in [facility]  PHQ-2 Total Score 0       Assessment and Plan: ENDRE COUTTS is a 59 year old Caucasian male, engaged, lives in [place], has a history of bipolar disorder, PTSD, panic attacks, chronic pain, multiple surgeries, coronary artery disease was evaluated by telemedicine today.  Patient is biologically predisposed given his multiple health issues, recent medical admission for intestinal ischemia.  Patient currently is struggling with mood lability since being off of his mood stabilizer Trileptal.  Patient also is struggling with attention and focus.  Plan as noted below.  Plan Bipolar disorder-unstable Increase Zyprexa to 7.5 mg p.o. nightly Mirtazapine 30 mg p.o. nightly Discontinue Trileptal-patient was taken off of this medication during his recent inpatient hospitalization.  Likely because of his platelet count being  low.   Panic attacks-improving Hydroxyzine 25 to 50 mg p.o. twice daily as needed for anxiety attacks.  He has been limiting use. Patient was referred for  CBT-declined  PTSD-stable Continue to monitor closely  Insomnia-stable Prazosin 1 mg p.o. nightly Temazepam 30 mg p.o. nightly Discussed with patient since the Zyprexa dosage is being increased that it is advisable to reduce the temazepam and use it only as needed.  Patient however reports lower dosage of temazepam as expensive for him and this is the only medication that works for him and hence is not interested in reducing the dosage today.  Discussed with patient to be cautious when combining the Zyprexa and temazepam and to use the temazepam only as needed.  He will limit use.  Tobacco use disorder-improving We will monitor closely  Bereavement-improving Patient currently is making progress and is not interested in grief counseling.  Cognitive issues/attention and focus problems-discussed with patient his cognitive problems attentional focus problems likely is multifactorial.  Discussed with patient he is on polypharmacy-currently on medications like hydroxyzine, temazepam, gabapentin, Topamax-all of which can contribute to cognitive issues in the long run.  Patient advised to continue to follow-up with providers to try to get off of some of these medications. Also discussed with patient since he was recently taken off of the Trileptal that could also have contributed to his concentration problems. However since patient is interested in getting back on ADHD medication and is unable to get previous records from his previous provider, will refer him for a testing.  This was discussed with patient and he is agreeable.  Will refer to [facility] neuropsychiatry [facility].  Follow-up in clinic in 6 weeks or sooner if needed.  I have spent atleast 30 minutes  face to face by video with patient today. More than 50 % of the time was spent for  preparing to see the patient ( e.g., review of test, records ), obtaining and to review and separately obtained history , ordering medications and test ,psychoeducation and supportive psychotherapy and care coordination,as well as documenting clinical information in electronic health record,interpreting results of test and communication of results This note was generated in part or whole with voice recognition software. Voice recognition is usually quite accurate but there are transcription errors that can and very often do occur. I apologize for any typographical errors that were not detected and corrected.       Jomarie Longs, MD 11/04/2019, 5:52 PM

## 2019-11-05 ENCOUNTER — Telehealth: Payer: Self-pay

## 2019-11-05 NOTE — Telephone Encounter (Signed)
faxed and confirmed referrall

## 2019-11-20 ENCOUNTER — Other Ambulatory Visit: Payer: Self-pay | Admitting: Psychiatry

## 2019-11-20 DIAGNOSIS — F4310 Post-traumatic stress disorder, unspecified: Secondary | ICD-10-CM

## 2019-12-18 ENCOUNTER — Telehealth: Payer: Medicare Other | Admitting: Psychiatry

## 2019-12-20 ENCOUNTER — Other Ambulatory Visit: Payer: Self-pay | Admitting: Psychiatry

## 2019-12-20 DIAGNOSIS — F4310 Post-traumatic stress disorder, unspecified: Secondary | ICD-10-CM

## 2020-01-04 ENCOUNTER — Other Ambulatory Visit: Payer: Self-pay | Admitting: Psychiatry

## 2020-01-04 DIAGNOSIS — F4310 Post-traumatic stress disorder, unspecified: Secondary | ICD-10-CM

## 2020-01-26 DEATH — deceased
